# Patient Record
Sex: Male | Born: 1980 | Race: Black or African American | Hispanic: No | Marital: Single | Smoking: Former smoker
Health system: Southern US, Community
[De-identification: ages and names within clinical notes are randomized; demographics above are authoritative.]

---

## 2013-06-01 ENCOUNTER — Emergency Department (HOSPITAL_COMMUNITY)
Admission: EM | Admit: 2013-06-01 | Discharge: 2013-06-01 | Disposition: A | Discharge status: Home / Self Care | Payer: Self-pay | Attending: Emergency Medicine | Admitting: Emergency Medicine

## 2013-06-01 ENCOUNTER — Encounter (HOSPITAL_COMMUNITY): Payer: Self-pay | Admitting: Emergency Medicine

## 2013-06-01 DIAGNOSIS — F17200 Nicotine dependence, unspecified, uncomplicated: Secondary | ICD-10-CM | POA: Insufficient documentation

## 2013-06-01 DIAGNOSIS — R05 Cough: Secondary | ICD-10-CM

## 2013-06-01 DIAGNOSIS — J069 Acute upper respiratory infection, unspecified: Secondary | ICD-10-CM | POA: Insufficient documentation

## 2013-06-01 DIAGNOSIS — R059 Cough, unspecified: Secondary | ICD-10-CM

## 2013-06-01 MED ORDER — ACETAMINOPHEN 325 MG PO TABS
650.0000 mg | ORAL_TABLET | Freq: Once | ORAL | Status: AC
  Administered 2013-06-01: 650 mg via ORAL
  Filled 2013-06-01: qty 2

## 2013-06-01 MED ORDER — IBUPROFEN 800 MG PO TABS: 800.0000 mg | ORAL_TABLET | Freq: Three times a day (TID) | ORAL | Status: DC

## 2013-06-01 MED ORDER — GUAIFENESIN-CODEINE 100-10 MG/5ML PO SYRP: 10.0000 mL | ORAL_SOLUTION | Freq: Three times a day (TID) | ORAL | Status: DC | PRN

## 2013-06-01 MED ORDER — GUAIFENESIN-CODEINE 100-10 MG/5ML PO SOLN
10.0000 mL | Freq: Once | ORAL | Status: AC
  Administered 2013-06-01: 10 mL via ORAL
  Filled 2013-06-01: qty 10

## 2013-06-01 NOTE — ED Provider Notes (Signed)
CSN: [id number]     Arrival date & time 06/01/13  1202 History   First MD Initiated Contact with Patient 06/01/13 1216     Chief Complaint  Patient presents with  . Fever  . Cough     (Consider location/radiation/quality/duration/timing/severity/associated sxs/prior Treatment) Patient is a 33 y.o. male presenting with cough. The history is provided by the patient.  Cough Cough characteristics:  Productive Sputum characteristics:  Yellow Severity:  Moderate Onset quality:  Gradual Duration:  1 day Timing:  Constant Progression:  Unchanged Chronicity:  New Smoker: yes   Relieved by:  Nothing Worsened by:  Nothing tried Ineffective treatments:  None tried Associated symptoms: chills and fever   Associated symptoms: no chest pain, no ear pain, no headaches, no myalgias, no rash, no rhinorrhea, no shortness of breath, no sore throat and no wheezing     History reviewed. No pertinent past medical history. History reviewed. No pertinent past surgical history. History reviewed. No pertinent family history. History  Substance Use Topics  . Smoking status: Current Every Day Smoker  . Smokeless tobacco: Not on file  . Alcohol Use: Yes    Review of Systems  Constitutional: Positive for fever and chills. Negative for activity change and appetite change.  HENT: Positive for congestion. Negative for ear pain, facial swelling, rhinorrhea, sore throat and trouble swallowing.   Eyes: Negative for visual disturbance.  Respiratory: Positive for cough. Negative for chest tightness, shortness of breath, wheezing and stridor.   Cardiovascular: Negative for chest pain.  Gastrointestinal: Negative for nausea and vomiting.  Musculoskeletal: Negative for myalgias, neck pain and neck stiffness.  Skin: Negative.  Negative for rash.  Neurological: Negative for dizziness, weakness, numbness and headaches.  Hematological: Negative for adenopathy.  Psychiatric/Behavioral: Negative for confusion.   All other systems reviewed and are negative.      Allergies  Review of patient's allergies indicates no known allergies.  Home Medications  No current outpatient prescriptions on file. BP 96/70  Pulse 85  Temp(Src) 100.4 F (38 C) (Oral)  Resp 20  Ht 6' (1.829 m)  Wt 210 lb (95.255 kg)  BMI 28.47 kg/m2  SpO2 99% Physical Exam  Nursing note and vitals reviewed. Constitutional: He is oriented to person, place, and time. He appears well-developed and well-nourished. No distress.  HENT:  Head: Normocephalic and atraumatic.  Right Ear: Tympanic membrane and ear canal normal.  Left Ear: Tympanic membrane and ear canal normal.  Mouth/Throat: Uvula is midline, oropharynx is clear and moist and mucous membranes are normal. No oropharyngeal exudate.  Eyes: EOM are normal. Pupils are equal, round, and reactive to light.  Neck: Normal range of motion, full passive range of motion without pain and phonation normal. Neck supple.  Cardiovascular: Normal rate, regular rhythm, normal heart sounds and intact distal pulses.   No murmur heard. Pulmonary/Chest: Effort normal. No stridor. No respiratory distress. He has no wheezes. He has no rales. He exhibits no tenderness.  Coarse lungs sounds bilaterally that improve after cough  No rales or wheezes  Abdominal: Soft. He exhibits no distension. There is no tenderness. There is no rebound and no guarding.  Musculoskeletal: He exhibits no edema.  Lymphadenopathy:    He has no cervical adenopathy.  Neurological: He is alert and oriented to person, place, and time. He exhibits normal muscle tone. Coordination normal.  Skin: Skin is warm and dry. No rash noted.    ED Course  Procedures (including critical care time) Labs Review Labs Reviewed - No  data to display Imaging Review No results found.   EKG Interpretation None      MDM   Final diagnoses:  Cough  URI (upper respiratory infection)    Patient is alert, non-toxic  appearing, tolerating po fluids.  VSS.  Likely viral illness .  Patient agrees to symptomatic treatment  The patient appears reasonably screened and/or stabilized for discharge and I doubt any other medical condition or other [facility] requiring further screening, evaluation, or treatment in the ED at this time prior to discharge.     Wallace Cogliano L. Trisha Mangleriplett, PA-C 06/02/13 2106

## 2013-06-01 NOTE — ED Notes (Signed)
Pt c/o fever and productive cough with yellow sputum since last night.

## 2013-06-01 NOTE — Discharge Instructions (Signed)
Cough, Adult  A cough is a reflex. It helps you clear your throat and airways. A cough can help heal your body. A cough can last 2 or 3 weeks (acute) or may last more than 8 weeks (chronic). Some common causes of a cough can include an infection, allergy, or a cold. HOME CARE  Only take medicine as told by your doctor.  If given, take your medicines (antibiotics) as told. Finish them even if you start to feel better.  Use a cold steam vaporizer or humidier in your home. This can help loosen thick spit (secretions).  Sleep so you are almost sitting up (semi-upright). Use pillows to do this. This helps reduce coughing.  Rest as needed.  Stop smoking if you smoke. GET HELP RIGHT AWAY IF:  You have yellowish-white fluid (pus) in your thick spit.  Your cough gets worse.  Your medicine does not reduce coughing, and you are losing sleep.  You cough up blood.  You have trouble breathing.  Your pain gets worse and medicine does not help.  You have a fever. MAKE SURE YOU:   Understand these instructions.  Will watch your condition.  Will get help right away if you are not doing well or get worse. Document Released: 11/21/2010 Document Revised: 06/02/2011 Document Reviewed: 11/21/2010 [employer] Patient Information 2014 [employer].  

## 2013-06-07 NOTE — ED Provider Notes (Signed)
Medical screening examination/treatment/procedure(s) were performed by non-physician practitioner and as supervising physician I was immediately available for consultation/collaboration.   EKG Interpretation None        Shelda JakesScott W. Giankarlo Leamer, MD 06/07/13 1410

## 2013-07-24 ENCOUNTER — Encounter (HOSPITAL_COMMUNITY): Payer: Self-pay | Admitting: Emergency Medicine

## 2013-07-24 ENCOUNTER — Emergency Department (HOSPITAL_COMMUNITY)
Admission: EM | Admit: 2013-07-24 | Discharge: 2013-07-24 | Disposition: A | Discharge status: Home / Self Care | Payer: Self-pay | Attending: Emergency Medicine | Admitting: Emergency Medicine

## 2013-07-24 DIAGNOSIS — X58XXXA Exposure to other specified factors, initial encounter: Secondary | ICD-10-CM | POA: Insufficient documentation

## 2013-07-24 DIAGNOSIS — S39012A Strain of muscle, fascia and tendon of lower back, initial encounter: Secondary | ICD-10-CM

## 2013-07-24 DIAGNOSIS — S335XXA Sprain of ligaments of lumbar spine, initial encounter: Secondary | ICD-10-CM | POA: Insufficient documentation

## 2013-07-24 DIAGNOSIS — F17200 Nicotine dependence, unspecified, uncomplicated: Secondary | ICD-10-CM | POA: Insufficient documentation

## 2013-07-24 DIAGNOSIS — Y939 Activity, unspecified: Secondary | ICD-10-CM | POA: Insufficient documentation

## 2013-07-24 DIAGNOSIS — Y929 Unspecified place or not applicable: Secondary | ICD-10-CM | POA: Insufficient documentation

## 2013-07-24 LAB — URINALYSIS, ROUTINE W REFLEX MICROSCOPIC
Bilirubin Urine: NEGATIVE
Glucose, UA: NEGATIVE mg/dL
Hgb urine dipstick: NEGATIVE
KETONES UR: NEGATIVE mg/dL
LEUKOCYTES UA: NEGATIVE
Nitrite: NEGATIVE
Protein, ur: NEGATIVE mg/dL
Specific Gravity, Urine: >1.03 — ABNORMAL HIGH (ref 1.005–1.030)
UROBILINOGEN UA: 1 mg/dL (ref 0.0–1.0)
pH: 6 (ref 5.0–8.0)

## 2013-07-24 MED ORDER — KETOROLAC TROMETHAMINE 60 MG/2ML IM SOLN
60.0000 mg | Freq: Once | INTRAMUSCULAR | Status: AC
  Administered 2013-07-24: 60 mg via INTRAMUSCULAR
  Filled 2013-07-24: qty 2

## 2013-07-24 MED ORDER — TRAMADOL HCL 50 MG PO TABS: 50.0000 mg | ORAL_TABLET | Freq: Four times a day (QID) | ORAL | Status: DC | PRN

## 2013-07-24 MED ORDER — METHOCARBAMOL 500 MG PO TABS
500.0000 mg | ORAL_TABLET | Freq: Once | ORAL | Status: AC
  Administered 2013-07-24: 500 mg via ORAL
  Filled 2013-07-24: qty 1

## 2013-07-24 MED ORDER — IBUPROFEN 600 MG PO TABS: 600.0000 mg | ORAL_TABLET | Freq: Four times a day (QID) | ORAL | Status: DC | PRN

## 2013-07-24 MED ORDER — HYDROCODONE-ACETAMINOPHEN 5-325 MG PO TABS
1.0000 | ORAL_TABLET | Freq: Once | ORAL | Status: AC
  Administered 2013-07-24: 1 via ORAL
  Filled 2013-07-24: qty 1

## 2013-07-24 MED ORDER — METHOCARBAMOL 500 MG PO TABS: 500.0000 mg | ORAL_TABLET | Freq: Three times a day (TID) | ORAL | Status: DC | PRN

## 2013-07-24 NOTE — Discharge Instructions (Signed)

## 2013-07-24 NOTE — ED Provider Notes (Signed)
CSN: [id number]     Arrival date & time 07/24/13  0013 History   First MD Initiated Contact with Patient 07/24/13 0020     No chief complaint on file.    (Consider location/radiation/quality/duration/timing/severity/associated sxs/prior Treatment) HPI  patient presents with new onset lower back pain starting several hours ago and has been constant and progressive since. He states started on the left side of his back and now is affecting the right side as well. The pain is worse with movement and lifting the right leg. He has no focal weakness or numbness. He has no dysuria, hematuria or hesitancy. He denies any fevers or chills. No nausea or vomiting. Patient does not know of any trauma or heavy lifting. History reviewed. No pertinent past medical history. History reviewed. No pertinent past surgical history. No family history on file. History  Substance Use Topics  . Smoking status: Current Every Day Smoker  . Smokeless tobacco: Not on file  . Alcohol Use: No    Review of Systems  Constitutional: Negative for fever and chills.  Respiratory: Negative for shortness of breath.   Cardiovascular: Negative for chest pain.  Gastrointestinal: Negative for nausea, vomiting and abdominal pain.  Genitourinary: Negative for dysuria, frequency, hematuria and flank pain.  Musculoskeletal: Positive for back pain and myalgias. Negative for neck pain and neck stiffness.  Skin: Negative for rash and wound.  Neurological: Negative for dizziness, weakness, light-headedness, numbness and headaches.      Allergies  Review of patient's allergies indicates no known allergies.  Home Medications   Prior to Admission medications   Medication Sig Start Date End Date Taking? Authorizing Provider  guaiFENesin-codeine (ROBITUSSIN AC) 100-10 MG/5ML syrup Take 10 mLs by mouth 3 (three) times daily as needed for cough. 06/01/13   Tammy L. Triplett, PA-C  ibuprofen (ADVIL,MOTRIN) 800 MG tablet Take 1 tablet  (800 mg total) by mouth 3 (three) times daily. 06/01/13   Tammy L. Triplett, PA-C   BP 150/78  Pulse 64  Temp(Src) 97.4 F (36.3 C) (Oral)  Resp 18  Ht 6' (1.829 m)  Wt 210 lb (95.255 kg)  BMI 28.47 kg/m2  SpO2 100% Physical Exam  Nursing note and vitals reviewed. Constitutional: He is oriented to person, place, and time. He appears well-developed and well-nourished. No distress.  HENT:  Head: Normocephalic and atraumatic.  Mouth/Throat: Oropharynx is clear and moist.  Eyes: EOM are normal. Pupils are equal, round, and reactive to light.  Neck: Normal range of motion. Neck supple.  Cardiovascular: Normal rate and regular rhythm.   Pulmonary/Chest: Effort normal and breath sounds normal. No respiratory distress. He has no wheezes. He has no rales.  Abdominal: Soft. Bowel sounds are normal. He exhibits no distension and no mass. There is no tenderness. There is no rebound and no guarding.  Musculoskeletal: Normal range of motion. He exhibits tenderness. He exhibits no edema.  No midline thoracic or lumbar tenderness to palpation. Patient does have diffuse lumbar paraspinal tenderness. No CVA tenderness. Negative straight leg raise the patient's pain is exacerbated with raising his right leg.  Neurological: He is alert and oriented to person, place, and time.  5/5 motor in all extremities. Patient is able to ambulate without difficulty. No numbness, specifically saddle anesthesia.  Skin: Skin is warm and dry. No rash noted. No erythema.  Psychiatric: He has a normal mood and affect. His behavior is normal.    ED Course  Procedures (including critical care time) Labs Review Labs Reviewed  URINALYSIS, ROUTINE  W REFLEX MICROSCOPIC    Imaging Review No results found.   EKG Interpretation None      MDM   Final diagnoses:  None    I have a low suspicion for renal stones as the cause of the patient's pain. We'll check a UA for any evidence of blood or infection. I also have a  very low suspicion for cauda equina syndrome since he has a normal neurologic exam. We'll treat symptomatically and reevaluate.  Pain is improved with treatment. No red blood cells in the urine. We'll treat for muscle strain. Patient's encouraged to return to the emergency department for worsening pain, fevers or chills, weakness or numbness or for any concerns.  Loren Raceravid Laureano Hetzer, MD 07/24/13 [phone]

## 2014-04-16 ENCOUNTER — Emergency Department (HOSPITAL_COMMUNITY): Payer: Self-pay

## 2014-04-16 ENCOUNTER — Encounter (HOSPITAL_COMMUNITY): Payer: Self-pay | Admitting: *Deleted

## 2014-04-16 ENCOUNTER — Emergency Department (HOSPITAL_COMMUNITY)
Admission: EM | Admit: 2014-04-16 | Discharge: 2014-04-16 | Disposition: A | Discharge status: Home / Self Care | Payer: Self-pay | Attending: Emergency Medicine | Admitting: Emergency Medicine

## 2014-04-16 DIAGNOSIS — Z79899 Other long term (current) drug therapy: Secondary | ICD-10-CM | POA: Insufficient documentation

## 2014-04-16 DIAGNOSIS — R519 Headache, unspecified: Secondary | ICD-10-CM

## 2014-04-16 DIAGNOSIS — J0110 Acute frontal sinusitis, unspecified: Secondary | ICD-10-CM | POA: Insufficient documentation

## 2014-04-16 DIAGNOSIS — R51 Headache: Secondary | ICD-10-CM

## 2014-04-16 DIAGNOSIS — Z720 Tobacco use: Secondary | ICD-10-CM | POA: Insufficient documentation

## 2014-04-16 MED ORDER — TETRACAINE HCL 0.5 % OP SOLN
1.0000 [drp] | Freq: Once | OPHTHALMIC | Status: AC
  Administered 2014-04-16: 1 [drp] via OPHTHALMIC
  Filled 2014-04-16: qty 2

## 2014-04-16 MED ORDER — AMOXICILLIN-POT CLAVULANATE 875-125 MG PO TABS: 1.0000 | ORAL_TABLET | Freq: Two times a day (BID) | ORAL | Status: DC

## 2014-04-16 MED ORDER — FLUORESCEIN SODIUM 1 MG OP STRP
2.0000 | ORAL_STRIP | Freq: Once | OPHTHALMIC | Status: AC
  Administered 2014-04-16: 2 via OPHTHALMIC
  Filled 2014-04-16: qty 2

## 2014-04-16 MED ORDER — KETOROLAC TROMETHAMINE 60 MG/2ML IM SOLN
60.0000 mg | Freq: Once | INTRAMUSCULAR | Status: AC
  Administered 2014-04-16: 60 mg via INTRAMUSCULAR
  Filled 2014-04-16: qty 2

## 2014-04-16 MED ORDER — AMOXICILLIN-POT CLAVULANATE 875-125 MG PO TABS
1.0000 | ORAL_TABLET | Freq: Once | ORAL | Status: AC
  Administered 2014-04-16: 1 via ORAL
  Filled 2014-04-16: qty 1

## 2014-04-16 NOTE — Discharge Instructions (Signed)
1. Medications: Augmentin, usual home medications 2. Treatment: rest, drink plenty of fluids, ibuprofen and tylenol for pain 3. Follow Up: Please followup with your primary doctor in 3 days for discussion of your diagnoses and further evaluation after today's visit; if you do not have a primary care doctor use the resource guide provided to find one; Please return to the ER for high fevers, stiff neck or other concerning symptoms.   Sinusitis Sinusitis is redness, soreness, and inflammation of the paranasal sinuses. Paranasal sinuses are air pockets within the bones of your face (beneath the eyes, the middle of the forehead, or above the eyes). In healthy paranasal sinuses, mucus is able to drain out, and air is able to circulate through them by way of your nose. However, when your paranasal sinuses are inflamed, mucus and air can become trapped. This can allow bacteria and other germs to grow and cause infection. Sinusitis can develop quickly and last only a short time (acute) or continue over a long period (chronic). Sinusitis that lasts for more than 12 weeks is considered chronic.  CAUSES  Causes of sinusitis include:  Allergies.  Structural abnormalities, such as displacement of the cartilage that separates your nostrils (deviated septum), which can decrease the air flow through your nose and sinuses and affect sinus drainage.  Functional abnormalities, such as when the small hairs (cilia) that line your sinuses and help remove mucus do not work properly or are not present. SIGNS AND SYMPTOMS  Symptoms of acute and chronic sinusitis are the same. The primary symptoms are pain and pressure around the affected sinuses. Other symptoms include:  Upper toothache.  Earache.  Headache.  Bad breath.  Decreased sense of smell and taste.  A cough, which worsens when you are lying flat.  Fatigue.  Fever.  Thick drainage from your nose, which often is green and may contain pus  (purulent).  Swelling and warmth over the affected sinuses. DIAGNOSIS  Your health care provider will perform a physical exam. During the exam, your health care provider may:  Look in your nose for signs of abnormal growths in your nostrils (nasal polyps).  Tap over the affected sinus to check for signs of infection.  View the inside of your sinuses (endoscopy) using an imaging device that has a light attached (endoscope). If your health care provider suspects that you have chronic sinusitis, one or more of the following tests may be recommended:  Allergy tests.  Nasal culture. A sample of mucus is taken from your nose, sent to a lab, and screened for bacteria.  Nasal cytology. A sample of mucus is taken from your nose and examined by your health care provider to determine if your sinusitis is related to an allergy. TREATMENT  Most cases of acute sinusitis are related to a viral infection and will resolve on their own within 10 days. Sometimes medicines are prescribed to help relieve symptoms (pain medicine, decongestants, nasal steroid sprays, or saline sprays).  However, for sinusitis related to a bacterial infection, your health care provider will prescribe antibiotic medicines. These are medicines that will help kill the bacteria causing the infection.  Rarely, sinusitis is caused by a fungal infection. In theses cases, your health care provider will prescribe antifungal medicine. For some cases of chronic sinusitis, surgery is needed. Generally, these are cases in which sinusitis recurs more than 3 times per year, despite other treatments. HOME CARE INSTRUCTIONS   Drink plenty of water. Water helps thin the mucus so your sinuses  can drain more easily.  Use a humidifier.  Inhale steam 3 to 4 times a day (for example, sit in the bathroom with the shower running).  Apply a warm, moist washcloth to your face 3 to 4 times a day, or as directed by your health care provider.  Use  saline nasal sprays to help moisten and clean your sinuses.  Take medicines only as directed by your health care provider.  If you were prescribed either an antibiotic or antifungal medicine, finish it all even if you start to feel better. SEEK IMMEDIATE MEDICAL CARE IF:  You have increasing pain or severe headaches.  You have nausea, vomiting, or drowsiness.  You have swelling around your face.  You have vision problems.  You have a stiff neck.  You have difficulty breathing. MAKE SURE YOU:   Understand these instructions.  Will watch your condition.  Will get help right away if you are not doing well or get worse. Document Released: 03/10/2005 Document Revised: 07/25/2013 Document Reviewed: 03/25/2011 [facility] Patient Information 2015 [place]. This information is not intended to replace advice given to you by your health care provider. Make sure you discuss any questions you have with your health care provider.

## 2014-04-16 NOTE — ED Notes (Signed)
Pt c/o increasing eye pain to left eye; pt states the pain started x 4 days ago and has gotten progressively worse; pt states walking or bending over makes the pain worse

## 2014-04-16 NOTE — ED Provider Notes (Signed)
CSN: [id number]     Arrival date & time 04/16/14  2041 History   First MD Initiated Contact with Patient 04/16/14 2051     Chief Complaint  Patient presents with  . Eye Pain     (Consider location/radiation/quality/duration/timing/severity/associated sxs/prior Treatment) Patient is a 34 y.o. male presenting with eye pain. The history is provided by the patient and medical records. No language interpreter was used.  Eye Pain Associated symptoms include headaches. Pertinent negatives include no abdominal pain, chest pain, coughing, diaphoresis, fatigue, fever, nausea, neck pain, rash or vomiting.     Alan Guzman is a 34 y.o. male  with a hx of no major medical Hx presents to the Emergency Department complaining of gradual, persistent, progressively worsening right eye pain described and pressure with intermittent stabbing onset 4 days ago. Pt reports that initially he only felt the pain when he bent over, but now the pain is present all the time.  He rates the pain at a 9/10.  He has been taking Tylenol with moderate relief.  Associated symptoms include transient blurry vision 2 days ago.  Pt reports URI ssx 2 weeks ago, but reports this does not feel like sinus pain or pressure.  Patient reports there is not pain to the globe of the eye, diplopia or pain with extraocular movements. He denies trauma to the face or eyes. He denies working in a job where the potential for foreign body to the eye exists.  History reviewed. No pertinent past medical history. History reviewed. No pertinent past surgical history. History reviewed. No pertinent family history. History  Substance Use Topics  . Smoking status: Current Every Day Smoker -- 0.50 packs/day    Types: Cigarettes  . Smokeless tobacco: Not on file  . Alcohol Use: Yes     Comment: occasionally    Review of Systems  Constitutional: Negative for fever, diaphoresis, appetite change, fatigue and unexpected weight change.  HENT: Negative for  mouth sores.   Eyes: Positive for pain. Negative for visual disturbance.  Respiratory: Negative for cough, chest tightness, shortness of breath and wheezing.   Cardiovascular: Negative for chest pain.  Gastrointestinal: Negative for nausea, vomiting, abdominal pain, diarrhea and constipation.  Endocrine: Negative for polydipsia, polyphagia and polyuria.  Genitourinary: Negative for dysuria, urgency, frequency and hematuria.  Musculoskeletal: Negative for back pain, neck pain and neck stiffness.  Skin: Negative for rash.  Allergic/Immunologic: Negative for immunocompromised state.  Neurological: Positive for headaches. Negative for syncope and light-headedness.  Hematological: Does not bruise/bleed easily.  Psychiatric/Behavioral: Negative for sleep disturbance. The patient is not nervous/anxious.       Allergies  Review of patient's allergies indicates no known allergies.  Home Medications   Prior to Admission medications   Medication Sig Start Date End Date Taking? Authorizing Provider  acetaminophen (TYLENOL) 500 MG tablet Take 500 mg by mouth every 6 (six) hours as needed for mild pain or moderate pain.   Yes Historical Provider, MD  amoxicillin-clavulanate (AUGMENTIN) 875-125 MG per tablet Take 1 tablet by mouth every 12 (twelve) hours. 04/16/14   Danitza Schoenfeldt, PA-C  guaiFENesin-codeine (ROBITUSSIN AC) 100-10 MG/5ML syrup Take 10 mLs by mouth 3 (three) times daily as needed for cough. Patient not taking: Reported on 04/16/2014 06/01/13   Tammy L. Triplett, PA-C  ibuprofen (ADVIL,MOTRIN) 600 MG tablet Take 1 tablet (600 mg total) by mouth every 6 (six) hours as needed. Patient not taking: Reported on 04/16/2014 07/24/13   Loren Racer, MD  ibuprofen (ADVIL,MOTRIN) 800  MG tablet Take 1 tablet (800 mg total) by mouth 3 (three) times daily. Patient not taking: Reported on 04/16/2014 06/01/13   Tammy L. Triplett, PA-C  methocarbamol (ROBAXIN) 500 MG tablet Take 1 tablet (500 mg  total) by mouth every 8 (eight) hours as needed for muscle spasms. Patient not taking: Reported on 04/16/2014 07/24/13   Loren Racer, MD  traMADol (ULTRAM) 50 MG tablet Take 1 tablet (50 mg total) by mouth every 6 (six) hours as needed. Patient not taking: Reported on 04/16/2014 07/24/13   Loren Racer, MD   BP 147/81 mmHg  Pulse 78  Temp(Src) 98.2 F (36.8 C) (Oral)  Resp 20  Ht 6' (1.829 m)  Wt 210 lb (95.255 kg)  BMI 28.47 kg/m2  SpO2 100% Physical Exam  Constitutional: He is oriented to person, place, and time. He appears well-developed and well-nourished. No distress.  HENT:  Head: Normocephalic and atraumatic.  Nose: Nose normal. No mucosal edema or rhinorrhea.  Mouth/Throat: Uvula is midline, oropharynx is clear and moist and mucous membranes are normal. No uvula swelling. No oropharyngeal exudate, posterior oropharyngeal edema, posterior oropharyngeal erythema or tonsillar abscesses.  Eyes: Conjunctivae, EOM and lids are normal. Pupils are equal, round, and reactive to light. Lids are everted and swept, no foreign bodies found. Right eye exhibits no chemosis, no discharge and no exudate. No foreign body present in the right eye. Left eye exhibits no chemosis, no discharge and no exudate. No foreign body present in the left eye. Right conjunctiva is not injected. Right conjunctiva has no hemorrhage. Left conjunctiva is not injected. Left conjunctiva has no hemorrhage.  Slit lamp exam:      The right eye shows no corneal abrasion, no corneal flare, no corneal ulcer, no foreign body, no fluorescein uptake and no anterior chamber bulge.       The left eye shows no corneal abrasion, no corneal flare, no corneal ulcer, no foreign body, no fluorescein uptake and no anterior chamber bulge.  Pupils equal round and reactive to light No vertical, horizontal or rotational nystagmus No Corneal abrasion noted with fluorescein exam No visible foreign body No corneal flare, ulcer or dendritic  staining  No herpetic lesions to the face or around the eye  Tono: R - 18; L - 17  Visual Acuity: Bilateral Distance: 20/20 ; R Distance: 20/20 ; L Distance: 20/15  Neck: Normal range of motion.  Full range of motion without pain No midline or paraspinal tenderness No nuchal rigidity; no meningeal signs  Cardiovascular: Normal rate, regular rhythm, normal heart sounds and intact distal pulses.   No murmur heard. Pulmonary/Chest: Effort normal. No respiratory distress.  Musculoskeletal: Normal range of motion.  Neurological: He is alert and oriented to person, place, and time.  Mental Status:  Alert, oriented, thought content appropriate. Speech fluent without evidence of aphasia. Able to follow 2 step commands without difficulty.   Cranial Nerves:  II:  Peripheral visual fields grossly normal, pupils equal, round, reactive to light III,IV, VI: ptosis not present, extra-ocular motions intact bilaterally  V,VII: smile symmetric, facial light touch sensation equal VIII: hearing grossly normal bilaterally  IX,X: gag reflex present  XI: bilateral shoulder shrug equal and strong XII: midline tongue extension   Skin: Skin is warm and dry. He is not diaphoretic. No erythema.  Psychiatric: He has a normal mood and affect.  Nursing note and vitals reviewed.   ED Course  Procedures (including critical care time) Labs Review Labs Reviewed - No data to  display  Imaging Review Ct Head Wo Contrast  04/16/2014   CLINICAL DATA:  History of increasing left eye pain for past 4 days. Headache. Initial evaluation.  EXAM: CT HEAD WITHOUT CONTRAST  TECHNIQUE: Contiguous axial images were obtained from the base of the skull through the vertex without intravenous contrast.  COMPARISON:  None.  FINDINGS: There is no acute intracranial hemorrhage or infarct. No mass lesion or midline shift. Gray-white matter differentiation is well maintained. Ventricles are normal in size without evidence of  hydrocephalus. CSF containing spaces are within normal limits. No extra-axial fluid collection.  The calvarium is intact.  Orbital soft tissues are within normal limits.  There is extensive mucosal thickening with opacification of the maxillary sinuses, ethmoidal air cells, sphenoid sinuses, and to a lesser extent the frontal sinuses, left greater than right. Air-fluid level within the left frontal sinus suggest active sinus disease. No mastoid effusion.  Scalp soft tissues are unremarkable.  IMPRESSION: 1. No acute intracranial process. 2. Moderate to advanced pansinus disease. Air-fluid level within the left frontal sinus suggests active sinusitis. This may be either infectious or inflammatory in nature.   Electronically Signed   By: Rise MuBenjamin  McClintock M.D.   On: 04/16/2014 22:46     EKG Interpretation None      MDM   Final diagnoses:  Headache  Acute frontal sinusitis, recurrence not specified   Alan Guzman presents with left eye and forehead pain.  Normal eye exam.  Will obtain CT head and reassess.  Pt reports pain is now at a 5/10 which he believes is from the Tylenol he took PTA.  No evidence of acute angle glaucoma. No visual deficits, doubt optic neuritis.  No visual field cuts and no evidence of CVA.  No evidence of iritis.  10:59 PM CT with moderate to advanced pansinus disease. Air-fluid level within the left frontal sinus suggests active sinusitis.  Severe symptoms have been present several days occuring almost 2 weeks after a URI.  Acute sinusitis via CT scan.  Concern for acute bacterial rhinosinusitis.  Patient discharged with Augmentin.  Instructions given for warm saline nasal wash and recommendations for follow-up with primary care physician.    I have personally reviewed patient's vitals, nursing note and any pertinent labs or imaging.  I performed an focused physical exam; undressed when appropriate .    It has been determined that no acute conditions requiring further  emergency intervention are present at this time. The patient/guardian have been advised of the diagnosis and plan. I reviewed any labs and imaging including any potential incidental findings. We have discussed signs and symptoms that warrant return to the ED and they are listed in the discharge instructions.    Vital signs are stable at discharge.   BP 147/81 mmHg  Pulse 78  Temp(Src) 98.2 F (36.8 C) (Oral)  Resp 20  Ht 6' (1.829 m)  Wt 210 lb (95.255 kg)  BMI 28.47 kg/m2  SpO2 100%  The patient was discussed with and seen by Dr. Judd Lienelo who agrees with the treatment plan.     Dahlia ClientHannah Tameka Hoiland, PA-C 04/16/14 [id number]  Geoffery Lyonsouglas Delo, MD 04/16/14 [phone]

## 2015-06-20 ENCOUNTER — Encounter (HOSPITAL_COMMUNITY): Payer: Self-pay | Admitting: *Deleted

## 2015-06-20 ENCOUNTER — Emergency Department (HOSPITAL_COMMUNITY): Payer: Self-pay

## 2015-06-20 ENCOUNTER — Emergency Department (HOSPITAL_COMMUNITY)
Admission: EM | Admit: 2015-06-20 | Discharge: 2015-06-20 | Disposition: A | Discharge status: Home / Self Care | Payer: Self-pay | Attending: Emergency Medicine | Admitting: Emergency Medicine

## 2015-06-20 DIAGNOSIS — M25562 Pain in left knee: Secondary | ICD-10-CM | POA: Insufficient documentation

## 2015-06-20 DIAGNOSIS — F17210 Nicotine dependence, cigarettes, uncomplicated: Secondary | ICD-10-CM | POA: Insufficient documentation

## 2015-06-20 MED ORDER — ACETAMINOPHEN 325 MG PO TABS
650.0000 mg | ORAL_TABLET | Freq: Once | ORAL | Status: AC
  Administered 2015-06-20: 650 mg via ORAL
  Filled 2015-06-20: qty 2

## 2015-06-20 MED ORDER — DEXAMETHASONE 4 MG PO TABS: 4.0000 mg | ORAL_TABLET | Freq: Two times a day (BID) | ORAL | Status: DC

## 2015-06-20 MED ORDER — TRAMADOL HCL 50 MG PO TABS: 50.0000 mg | ORAL_TABLET | Freq: Four times a day (QID) | ORAL | Status: DC | PRN

## 2015-06-20 MED ORDER — DICLOFENAC SODIUM 75 MG PO TBEC
75.0000 mg | DELAYED_RELEASE_TABLET | Freq: Two times a day (BID) | ORAL | Status: DC
Start: 2015-06-20 — End: 2015-06-28

## 2015-06-20 MED ORDER — KETOROLAC TROMETHAMINE 10 MG PO TABS
10.0000 mg | ORAL_TABLET | Freq: Once | ORAL | Status: AC
  Administered 2015-06-20: 10 mg via ORAL
  Filled 2015-06-20: qty 1

## 2015-06-20 NOTE — Discharge Instructions (Signed)
The x-ray of your knee is negative for fracture or dislocation or joint effusion. Please see Dr. Romeo AppleHarrison for additional evaluation and management of your pain, and to evaluate any other reason you should have the knee symptoms. Please use the knee immobilizer when you're up and about, you did not need to sleep in this device. Use Decadron and diclofenac 2 times daily with food until all taken. May use Ultram for more severe pain. This medication may cause drowsiness. Please do not drive, drink alcohol, operate machinery when using this medication. Knee Pain Knee pain is a very common symptom and can have many causes. Knee pain often goes away when you follow your health care provider's instructions for relieving pain and discomfort at home. However, knee pain can develop into a condition that needs treatment. Some conditions may include:  Arthritis caused by wear and tear (osteoarthritis).  Arthritis caused by swelling and irritation (rheumatoid arthritis or gout).  A cyst or growth in your knee.  An infection in your knee joint.  An injury that will not heal.  Damage, swelling, or irritation of the tissues that support your knee (torn ligaments or tendinitis). If your knee pain continues, additional tests may be ordered to diagnose your condition. Tests may include X-rays or other imaging studies of your knee. You may also need to have fluid removed from your knee. Treatment for ongoing knee pain depends on the cause, but treatment may include:  Medicines to relieve pain or swelling.  Steroid injections in your knee.  Physical therapy.  Surgery. HOME CARE INSTRUCTIONS  Take medicines only as directed by your health care provider.  Rest your knee and keep it raised (elevated) while you are resting.  Do not do things that cause or worsen pain.  Avoid high-impact activities or exercises, such as running, jumping rope, or doing jumping jacks.  Apply ice to the knee area:  Put ice in  a plastic bag.  Place a towel between your skin and the bag.  Leave the ice on for 20 minutes, 2-3 times a day.  Ask your health care provider if you should wear an elastic knee support.  Keep a pillow under your knee when you sleep.  Lose weight if you are overweight. Extra weight can put pressure on your knee.  Do not use any tobacco products, including cigarettes, chewing tobacco, or electronic cigarettes. If you need help quitting, ask your health care provider. Smoking may slow the healing of any bone and joint problems that you may have. SEEK MEDICAL CARE IF:  Your knee pain continues, changes, or gets worse.  You have a fever along with knee pain.  Your knee buckles or locks up.  Your knee becomes more swollen. SEEK IMMEDIATE MEDICAL CARE IF:   Your knee joint feels hot to the touch.  You have chest pain or trouble breathing.   This information is not intended to replace advice given to you by your health care provider. Make sure you discuss any questions you have with your health care provider.   Document Released: 01/05/2007 Document Revised: 03/31/2014 Document Reviewed: 10/24/2013 Elsevier Interactive Patient Education [employer]2016 [employer] Inc[employer]

## 2015-06-20 NOTE — ED Provider Notes (Signed)
CSN: [id number]     Arrival date & time 06/20/15  [id number] History   None    Chief Complaint  Patient presents with  . Knee Pain     (Consider location/radiation/quality/duration/timing/severity/associated sxs/prior Treatment) Patient is a 35 y.o. male presenting with knee pain. The history is provided by the patient.  Knee Pain Location:  Knee Time since incident:  5 days Injury: no   Knee location:  L knee Pain details:    Quality:  Aching and shooting   Severity:  Moderate   Onset quality:  Gradual   Duration:  5 days   Timing:  Intermittent   Progression:  Worsening Chronicity:  New Dislocation: no   Relieved by:  Nothing Worsened by:  Bearing weight Ineffective treatments:  NSAIDs Associated symptoms: decreased ROM   Associated symptoms: no back pain, no fever and no neck pain   Risk factors: no frequent fractures and no recent illness     History reviewed. No pertinent past medical history. History reviewed. No pertinent past surgical history. History reviewed. No pertinent family history. Social History  Substance Use Topics  . Smoking status: Current Every Day Smoker -- 0.50 packs/day    Types: Cigarettes  . Smokeless tobacco: None  . Alcohol Use: Yes     Comment: occasionally    Review of Systems  Constitutional: Negative for fever and activity change.       All ROS Neg except as noted in HPI  HENT: Negative for nosebleeds.   Eyes: Negative for photophobia and discharge.  Respiratory: Negative for cough, shortness of breath and wheezing.   Cardiovascular: Negative for chest pain and palpitations.  Gastrointestinal: Negative for abdominal pain and blood in stool.  Genitourinary: Negative for dysuria, frequency and hematuria.  Musculoskeletal: Negative for back pain, arthralgias and neck pain.  Skin: Negative.   Neurological: Negative for dizziness, seizures and speech difficulty.  Psychiatric/Behavioral: Negative for hallucinations and confusion.       Allergies  Review of patient's allergies indicates no known allergies.  Home Medications   Prior to Admission medications   Medication Sig Start Date End Date Taking? Authorizing Provider  acetaminophen (TYLENOL) 500 MG tablet Take 500 mg by mouth every 6 (six) hours as needed for mild pain or moderate pain.    Historical Provider, MD  amoxicillin-clavulanate (AUGMENTIN) 875-125 MG per tablet Take 1 tablet by mouth every 12 (twelve) hours. 04/16/14   Hannah Muthersbaugh, PA-C  guaiFENesin-codeine (ROBITUSSIN AC) 100-10 MG/5ML syrup Take 10 mLs by mouth 3 (three) times daily as needed for cough. Patient not taking: Reported on 04/16/2014 06/01/13   Tammy Triplett, PA-C  ibuprofen (ADVIL,MOTRIN) 600 MG tablet Take 1 tablet (600 mg total) by mouth every 6 (six) hours as needed. Patient not taking: Reported on 04/16/2014 07/24/13   Loren Raceravid Yelverton, MD  ibuprofen (ADVIL,MOTRIN) 800 MG tablet Take 1 tablet (800 mg total) by mouth 3 (three) times daily. Patient not taking: Reported on 04/16/2014 06/01/13   Tammy Triplett, PA-C  methocarbamol (ROBAXIN) 500 MG tablet Take 1 tablet (500 mg total) by mouth every 8 (eight) hours as needed for muscle spasms. Patient not taking: Reported on 04/16/2014 07/24/13   Loren Raceravid Yelverton, MD  traMADol (ULTRAM) 50 MG tablet Take 1 tablet (50 mg total) by mouth every 6 (six) hours as needed. Patient not taking: Reported on 04/16/2014 07/24/13   Loren Raceravid Yelverton, MD   BP 155/80 mmHg  Pulse 77  Temp(Src) 98 F (36.7 C) (Oral)  Resp 18  Ht 6' (  1.829 m)  Wt 108.863 kg  BMI 32.54 kg/m2  SpO2 100% Physical Exam  Constitutional: He is oriented to person, place, and time. He appears well-developed and well-nourished.  Non-toxic appearance.  HENT:  Head: Normocephalic.  Right Ear: Tympanic membrane and external ear normal.  Left Ear: Tympanic membrane and external ear normal.  Eyes: EOM and lids are normal. Pupils are equal, round, and reactive to light.  Neck: Normal  range of motion. Neck supple. Carotid bruit is not present.  Cardiovascular: Normal rate, regular rhythm, normal heart sounds, intact distal pulses and normal pulses.   Pulmonary/Chest: Breath sounds normal. No respiratory distress.  Abdominal: Soft. Bowel sounds are normal. There is no tenderness. There is no guarding.  Musculoskeletal:       Left knee: He exhibits decreased range of motion. He exhibits no swelling, no effusion and no deformity.  Pain with attempted flexion. No posterior mass.  Lymphadenopathy:       Head (right side): No submandibular adenopathy present.       Head (left side): No submandibular adenopathy present.    He has no cervical adenopathy.  Neurological: He is alert and oriented to person, place, and time. He has normal strength. No cranial nerve deficit or sensory deficit.  Skin: Skin is warm and dry.  Psychiatric: He has a normal mood and affect. His speech is normal.  Nursing note and vitals reviewed.   ED Course  Procedures (including critical care time) Labs Review Labs Reviewed - No data to display  Imaging Review Dg Knee Complete 4 Views Left  06/20/2015  CLINICAL DATA:  Pain about the left patella radiating into the upper and lower leg for 1 week. No known injury. Initial encounter. EXAM: LEFT KNEE - COMPLETE 4+ VIEW COMPARISON:  None. FINDINGS: There is no evidence of fracture, dislocation, or joint effusion. There is no evidence of arthropathy or other focal bone abnormality. Soft tissues are unremarkable. IMPRESSION: Normal study. Electronically Signed   By: Drusilla Kanner M.D.   On: 06/20/2015 07:47   I have personally reviewed and evaluated these images and lab results as part of my medical decision-making.   EKG Interpretation None      MDM  Xray of the left knee is negative. No effusion. No hot joint. Significant limitation with attempted flexing the knee. No hot joint, temp elevation or tachycardia. No fx seen on xray. Pt referred to  Dr Romeo Apple for orthopedic evaluation.  Rx for diclofenac, ultram, and decadron given to the patient. Pt provided a knee immobilizer.   Final diagnoses:  Knee pain, left    *I have reviewed nursing notes, vital signs, and all appropriate lab and imaging results for this patient.Ivery Quale, PA-C 06/20/15 1018  Donnetta Hutching, MD 06/20/15 [phone]

## 2015-06-20 NOTE — ED Notes (Signed)
Pt reports left knee pain. Denies injury but states he has a stabbing pain and has trouble bearing weight at times.

## 2015-06-21 ENCOUNTER — Ambulatory Visit (INDEPENDENT_AMBULATORY_CARE_PROVIDER_SITE_OTHER): Payer: BLUE CROSS/BLUE SHIELD | Admitting: Orthopaedic Surgery

## 2015-06-21 ENCOUNTER — Encounter: Payer: Self-pay | Admitting: Orthopaedic Surgery

## 2015-06-21 VITALS — BP 163/90 | HR 84 | Temp 99.3°F | Ht 72.0 in | Wt 247.2 lb

## 2015-06-21 DIAGNOSIS — M25562 Pain in left knee: Secondary | ICD-10-CM

## 2015-06-21 MED ORDER — TRAMADOL HCL 50 MG PO TABS: 50.0000 mg | ORAL_TABLET | Freq: Four times a day (QID) | ORAL | Status: DC | PRN

## 2015-06-21 NOTE — Progress Notes (Signed)
Subjective: Left knee pain    Patient ID: Alan Guzman, male    DOB: 09/03/1980, 35 y.o.   MRN: [id number]  Knee Pain  The incident occurred 5 to 7 days ago. There was no injury mechanism. The pain is present in the left knee. The quality of the pain is described as aching, burning and stabbing. The pain is at a severity of 5/10. The pain is moderate. The pain has been worsening since onset. Associated symptoms include an inability to bear weight and a loss of motion. Pertinent negatives include no loss of sensation, muscle weakness, numbness or tingling. The symptoms are aggravated by weight bearing. He has tried acetaminophen, heat, elevation, ice, immobilization, non-weight bearing and rest for the symptoms. The treatment provided mild relief.   He began having pain in the left knee about 6 or 7 days ago.  He has no trauma.  He has swelling.  He cannot bear weight on the knee.  He cannot extend the knee.  He has giving way of the left knee.  He was seen in the ER on 06/20/15.  I have reviewed the x-rays, the x-ray report and the ER records.  He was given a knee immobilizer from the ER.  I have given him a knee sleeve in the office today and Rx for Crutches.  He will need a MRI of his left knee has he lacks full extension by 5 degrees and has instability of the knee.  I am concerned about a possible medial meniscus tear.   Review of Systems  Constitutional:       He is an active smoker.  HENT: Negative for congestion.   Respiratory: Negative for cough and shortness of breath.   Cardiovascular: Negative for chest pain.  Endocrine: Negative for cold intolerance.  Musculoskeletal: Positive for myalgias, joint swelling, arthralgias and gait problem.  Allergic/Immunologic: Negative for environmental allergies.  Neurological: Negative for tingling and numbness.  All other systems reviewed and are negative.  History reviewed. No pertinent past medical history. History reviewed. No pertinent  past surgical history.    Social History   Social History  . Marital Status: Single    Spouse Name: N/A  . Number of Children: N/A  . Years of Education: N/A   Occupational History  . Not on file.   Social History Main Topics  . Smoking status: Current Every Day Smoker -- 0.50 packs/day    Types: Cigarettes  . Smokeless tobacco: Not on file  . Alcohol Use: Yes     Comment: occasionally  . Drug Use: No  . Sexual Activity: Not on file   Other Topics Concern  . Not on file   Social History Narrative   BP 163/90 mmHg  Pulse 84  Temp(Src) 99.3 F (37.4 C)  Ht 6' (1.829 m)  Wt 247 lb 3.2 oz (112.129 kg)  BMI 33.52 kg/m2  Objective:   Physical Exam  Constitutional: He is oriented to person, place, and time. He appears well-developed and well-nourished.  HENT:  Head: Normocephalic and atraumatic.  Eyes: Conjunctivae and EOM are normal. Pupils are equal, round, and reactive to light.  Neck: Normal range of motion. Neck supple.  Cardiovascular: Normal rate, regular rhythm and intact distal pulses.   Pulmonary/Chest: Effort normal.  Abdominal: Soft.  Musculoskeletal: He exhibits tenderness (Pain left knee, effusion 1+, ROM 5 to 95 with pain, Positive Medial McMurray, no redness, NV intact.).       Left knee: He exhibits decreased range  of motion and effusion. Tenderness found. Medial joint line tenderness noted.       Legs: Neurological: He is alert and oriented to person, place, and time. He has normal reflexes. No cranial nerve deficit. He exhibits normal muscle tone. Coordination normal.  Skin: Skin is warm and dry.  Psychiatric: He has a normal mood and affect. His behavior is normal. Judgment and thought content normal.    Encounter Diagnosis  Name Primary?  . Left knee pain Yes   I have given a knee sleeve.  I have given Rx for crutches.  I have recommended MRI of the left knee as he lacks full extension, has positive medial McMurray and instability.  I am  concerned about a medial meniscus tear.     Assessment & Plan:  Get MRI of the left knee.  Use crutches.  Return after the MRI.  Call if any problem.  Stay off work.  I can fill out FLA form.

## 2015-06-21 NOTE — Patient Instructions (Signed)
Out of work 

## 2015-06-26 ENCOUNTER — Telehealth: Payer: Self-pay | Admitting: Orthopaedic Surgery

## 2015-06-26 ENCOUNTER — Encounter: Payer: Self-pay | Admitting: Orthopaedic Surgery

## 2015-06-26 ENCOUNTER — Ambulatory Visit (HOSPITAL_COMMUNITY)
Admission: RE | Admit: 2015-06-26 | Discharge: 2015-06-26 | Disposition: A | Discharge status: Home / Self Care | Payer: BLUE CROSS/BLUE SHIELD | Source: Ambulatory Visit | Attending: Orthopaedic Surgery | Admitting: Orthopaedic Surgery

## 2015-06-26 ENCOUNTER — Ambulatory Visit (INDEPENDENT_AMBULATORY_CARE_PROVIDER_SITE_OTHER): Payer: BLUE CROSS/BLUE SHIELD | Admitting: Orthopaedic Surgery

## 2015-06-26 ENCOUNTER — Other Ambulatory Visit: Payer: Self-pay | Admitting: *Deleted

## 2015-06-26 DIAGNOSIS — M25362 Other instability, left knee: Secondary | ICD-10-CM

## 2015-06-26 DIAGNOSIS — M2242 Chondromalacia patellae, left knee: Secondary | ICD-10-CM | POA: Insufficient documentation

## 2015-06-26 DIAGNOSIS — M25562 Pain in left knee: Secondary | ICD-10-CM | POA: Diagnosis not present

## 2015-06-26 DIAGNOSIS — M948X6 Other specified disorders of cartilage, lower leg: Secondary | ICD-10-CM | POA: Insufficient documentation

## 2015-06-26 NOTE — Telephone Encounter (Signed)
Regarding MRI CPT [id number], contacted insurer [employer]; [facility] Dr Hilda LiasKeeling then personally spoke with, and received approval for MRI, pre-authorization# [id number]. MRI has been scheduled for today, 06/27/15, stat. Patient aware.

## 2015-06-27 ENCOUNTER — Telehealth: Payer: Self-pay | Admitting: Orthopaedic Surgery

## 2015-06-27 NOTE — Telephone Encounter (Signed)
Patient called to inquire about MRI results - ph# [phone]

## 2015-06-27 NOTE — Telephone Encounter (Signed)
He is to come in.  I was expecting him today.  I do not give results over the telephone.  He can look on MyChart if he wants anytime.

## 2015-06-27 NOTE — Telephone Encounter (Signed)
Spoke with patient 1:49pm and relayed Dr Hilda LiasKeeling is expecting him today to review results. Patient states unable to come today; therefore, scheduled for tomorrow, 06/28/15, 8:30am

## 2015-06-28 ENCOUNTER — Ambulatory Visit (INDEPENDENT_AMBULATORY_CARE_PROVIDER_SITE_OTHER): Payer: BLUE CROSS/BLUE SHIELD | Admitting: Orthopaedic Surgery

## 2015-06-28 ENCOUNTER — Encounter: Payer: Self-pay | Admitting: Orthopaedic Surgery

## 2015-06-28 VITALS — BP 156/112 | HR 67 | Temp 98.4°F | Ht 72.0 in | Wt 252.2 lb

## 2015-06-28 DIAGNOSIS — M25562 Pain in left knee: Secondary | ICD-10-CM | POA: Diagnosis not present

## 2015-06-28 MED ORDER — DICLOFENAC SODIUM 75 MG PO TBEC: 75.0000 mg | DELAYED_RELEASE_TABLET | Freq: Two times a day (BID) | ORAL | Status: DC

## 2015-06-28 NOTE — Patient Instructions (Signed)
Out of work 

## 2015-06-28 NOTE — Progress Notes (Signed)
Patient ZO:XWRU:Alan Guzman Guzman, male DOB:09/28/1980, 35 y.o. EAV:[id number]  Chief Complaint  Patient presents with  . Knee Pain    left side    HPI  Alan Guzman is a 35 y.o. male who is seen for follow-up of the left knee pain.  He had a MRI done on 06/26/15.  Findings were:  IMPRESSION: 1. No meniscal or ligamentous injury of the left knee. 2. 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  He is using a knee sleeve.  His pain is less.  He still cannot fully extend the knee but he has less swelling and less tenderness of the joint lines.  He has no new trauma.  He has no locking or new trauma.  He has no redness. HPI  Body mass index is 34.2 kg/(m^2).  Review of Systems  Constitutional:       He is an active smoker.  HENT: Negative for congestion.   Respiratory: Negative for cough and shortness of breath.   Cardiovascular: Negative for chest pain.  Endocrine: Negative for cold intolerance.  Musculoskeletal: Positive for myalgias, joint swelling, arthralgias and gait problem.  Allergic/Immunologic: Negative for environmental allergies.  Neurological: Negative for numbness.  All other systems reviewed and are negative.   No past medical history on file.  No past surgical history on file.  No family history on file.  Social History Social History  Substance Use Topics  . Smoking status: Current Every Day Smoker -- 0.50 packs/day    Types: Cigarettes  . Smokeless tobacco: Never Used  . Alcohol Use: 0.0 oz/week    0 Standard drinks or equivalent per week     Comment: occasionally    No Known Allergies  Current Outpatient Prescriptions  Medication Sig Dispense Refill  . acetaminophen (TYLENOL) 500 MG tablet Take 500 mg by mouth every 6 (six) hours as needed for mild pain or moderate pain.    Marland Kitchen. dexamethasone (DECADRON) 4 MG tablet Take 1 tablet (4 mg total) by mouth 2 (two) times  daily with a meal. 12 tablet 0  . ibuprofen (ADVIL,MOTRIN) 800 MG tablet Take 1 tablet (800 mg total) by mouth 3 (three) times daily. 21 tablet 0  . traMADol (ULTRAM) 50 MG tablet Take 1 tablet (50 mg total) by mouth every 6 (six) hours as needed. 15 tablet 0  . traMADol (ULTRAM) 50 MG tablet Take 1 tablet (50 mg total) by mouth every 6 (six) hours as needed. 60 tablet 3  . diclofenac (VOLTAREN) 75 MG EC tablet Take 1 tablet (75 mg total) by mouth 2 (two) times daily with a meal. 60 tablet 2   No current facility-administered medications for this visit.     Physical Exam  Blood pressure 156/112, pulse 67, temperature 98.4 F (36.9 C), temperature source Tympanic, height 6' (1.829 m), weight 252 lb 3.2 oz (114.397 kg).  Constitutional: overall normal hygiene, normal nutrition, well developed, normal grooming, normal body habitus. Assistive device:braces  Musculoskeletal: gait and station Limp left, muscle tone and strength are normal, no tremors or atrophy is present.  .  Neurological: coordination overall normal.  Deep tendon reflex/nerve stretch intact.  Sensation normal.  Cranial nerves II-XII intact.   Skin:   normal overall no scars, lesions, ulcers or rashes. No psoriasis.  Psychiatric: Alert and oriented x 3.  Recent memory intact, remote memory unclear.  Normal mood and affect. Well groomed.  Good eye contact.  Cardiovascular: overall  no swelling, no varicosities, no edema bilaterally, normal temperatures of the legs and arms, no clubbing, cyanosis and good capillary refill.  Lymphatic: palpation is normal.  The left lower extremity is examined:  Inspection:  Thigh:  Non-tender and no defects  Knee has swelling 2+ effusion.                        Joint tenderness is present                        Patient is tender over the medial joint line  Lower Leg:  Has normal appearance and no tenderness or defects  Ankle:  Non-tender and no defects  Foot:  Non-tender and no  defects Range of Motion:  Knee:  Range of motion is: 5-105                        Crepitus is  present  Ankle:  Range of motion is normal. Strength and Tone:  The left lower extremity has normal strength and tone. Stability:  Knee:  The knee is stable.  Ankle:  The ankle is stable.   The patient has been educated about the nature of the problem(s) and counseled on treatment options.  The patient appeared to understand what I have discussed and is in agreement with it.  Encounter Diagnosis  Name Primary?  . Left knee pain Yes    PLAN  I have gone over the findings of the MRI.  I have asked he stay out of work and limit weight bearing to the left knee.  Continue the knee sleeve.  Call if any problems.  Precautions discussed.  Continue current medications.   Return to clinic 2 weeks   X-rays on return of the left knee.

## 2015-07-10 ENCOUNTER — Ambulatory Visit (INDEPENDENT_AMBULATORY_CARE_PROVIDER_SITE_OTHER): Payer: BLUE CROSS/BLUE SHIELD | Admitting: Orthopaedic Surgery

## 2015-07-10 ENCOUNTER — Ambulatory Visit (INDEPENDENT_AMBULATORY_CARE_PROVIDER_SITE_OTHER): Payer: BLUE CROSS/BLUE SHIELD

## 2015-07-10 ENCOUNTER — Encounter: Payer: Self-pay | Admitting: Orthopaedic Surgery

## 2015-07-10 VITALS — BP 154/95 | HR 80 | Temp 99.5°F | Ht 72.0 in | Wt 250.0 lb

## 2015-07-10 DIAGNOSIS — M25562 Pain in left knee: Secondary | ICD-10-CM

## 2015-07-10 NOTE — Progress Notes (Signed)
Patient ZO:XWRU:Alan Guzman, male DOB:05/16/1980, 35 y.o. EAV:[id number]  Chief Complaint  Patient presents with  . Follow-up    left knee    HPI  Alan Guzman is a 35 y.o. male who has history of left knee injury.  MRI showed chondral fissure but no fracture.  He is doing well. He is using his crutches and knee wrap.  He has no trauma.  He is staying off the knee.  He is out of work. I will continue the same.  HPI  Body mass index is 33.9 kg/(m^2).   Review of Systems  Constitutional:       He is an active smoker.  HENT: Negative for congestion.   Respiratory: Negative for cough and shortness of breath.   Cardiovascular: Negative for chest pain.  Endocrine: Negative for cold intolerance.  Musculoskeletal: Positive for myalgias, joint swelling, arthralgias and gait problem.  Allergic/Immunologic: Negative for environmental allergies.  Neurological: Negative for numbness.  All other systems reviewed and are negative.   History reviewed. No pertinent past medical history.  History reviewed. No pertinent past surgical history.  History reviewed. No pertinent family history.  Social History Social History  Substance Use Topics  . Smoking status: Current Every Day Smoker -- 0.50 packs/day    Types: Cigarettes  . Smokeless tobacco: Never Used  . Alcohol Use: 0.0 oz/week    0 Standard drinks or equivalent per week     Comment: occasionally    No Known Allergies  Current Outpatient Prescriptions  Medication Sig Dispense Refill  . acetaminophen (TYLENOL) 500 MG tablet Take 500 mg by mouth every 6 (six) hours as needed for mild pain or moderate pain.    Marland Kitchen. dexamethasone (DECADRON) 4 MG tablet Take 1 tablet (4 mg total) by mouth 2 (two) times daily with a meal. 12 tablet 0  . diclofenac (VOLTAREN) 75 MG EC tablet Take 1 tablet (75 mg total) by mouth 2 (two) times daily with a meal. 60 tablet 2  . ibuprofen (ADVIL,MOTRIN) 800 MG tablet Take 1 tablet (800 mg total) by mouth 3  (three) times daily. 21 tablet 0  . traMADol (ULTRAM) 50 MG tablet Take 1 tablet (50 mg total) by mouth every 6 (six) hours as needed. 15 tablet 0  . traMADol (ULTRAM) 50 MG tablet Take 1 tablet (50 mg total) by mouth every 6 (six) hours as needed. 60 tablet 3   No current facility-administered medications for this visit.     Physical Exam  Blood pressure 154/95, pulse 80, temperature 99.5 F (37.5 C), height 6' (1.829 m), weight 250 lb (113.399 kg).  Constitutional: overall normal hygiene, normal nutrition, well developed, normal grooming, normal body habitus. Assistive device:crutches, braces  Musculoskeletal: gait and station Limp left, muscle tone and strength are normal, no tremors or atrophy is present.  .  Neurological: coordination overall normal.  Deep tendon reflex/nerve stretch intact.  Sensation normal.  Cranial nerves II-XII intact.   Skin:   normal overall no scars, lesions, ulcers or rashes. No psoriasis.  Psychiatric: Alert and oriented x 3.  Recent memory intact, remote memory unclear.  Normal mood and affect. Well groomed.  Good eye contact.  Cardiovascular: overall no swelling, no varicosities, no edema bilaterally, normal temperatures of the legs and arms, no clubbing, cyanosis and good capillary refill.  Lymphatic: palpation is normal.  The left lower extremity is examined:  Inspection:  Thigh:  Non-tender and no defects  Knee has swelling 1+ effusion.  Joint tenderness is present                        Patient is tender over the medial joint line  Lower Leg:  Has normal appearance and no tenderness or defects  Ankle:  Non-tender and no defects  Foot:  Non-tender and no defects Range of Motion:  Knee:  Range of motion is: 0-110                        Crepitus is not  present  Ankle:  Range of motion is normal. Strength and Tone:  The left lower extremity has normal strength and tone. Stability:  Knee:  The knee is  stable.  Ankle:  The ankle is stable.  The patient has been educated about the nature of the problem(s) and counseled on treatment options.  The patient appeared to understand what I have discussed and is in agreement with it.  Encounter Diagnosis  Name Primary?  . Left knee pain Yes    PLAN Call if any problems.  Precautions discussed.  Continue current medications.   Return to clinic 2 weeks   Remain out of work.

## 2015-07-10 NOTE — Patient Instructions (Signed)
Out of work  Continue crutches.  Call if any problem

## 2015-07-12 ENCOUNTER — Ambulatory Visit: Payer: BLUE CROSS/BLUE SHIELD | Admitting: Orthopaedic Surgery

## 2015-07-18 ENCOUNTER — Ambulatory Visit: Payer: BLUE CROSS/BLUE SHIELD | Admitting: Orthopaedic Surgery

## 2015-07-24 ENCOUNTER — Encounter: Payer: Self-pay | Admitting: Orthopaedic Surgery

## 2015-07-24 ENCOUNTER — Ambulatory Visit (INDEPENDENT_AMBULATORY_CARE_PROVIDER_SITE_OTHER): Payer: BLUE CROSS/BLUE SHIELD | Admitting: Orthopaedic Surgery

## 2015-07-24 VITALS — BP 151/98 | HR 85 | Temp 99.9°F | Ht 72.0 in | Wt 245.0 lb

## 2015-07-24 DIAGNOSIS — M25562 Pain in left knee: Secondary | ICD-10-CM | POA: Diagnosis not present

## 2015-07-24 NOTE — Patient Instructions (Signed)
Stay out of work. 

## 2015-07-24 NOTE — Progress Notes (Signed)
Patient ZO:XWRU:Alan Guzman, male DOB:06/11/1980, 35 y.o. EAV:[id number]  Chief Complaint  Patient presents with  . Follow-up    left knee    HPI  Alan Guzman is a 35 y.o. male who has left knee pain.  He had a MRI that showed a chondral fissure but no fracture.  He is doing better.  He is wearing a knee brace now and walking better.  He has some pain laterally but overall doing well.  He has less swelling.  He has no new trauma.  HPI  Body mass index is 33.22 kg/(m^2).  Review of Systems  Constitutional:       He is an active smoker.  HENT: Negative for congestion.   Respiratory: Negative for cough and shortness of breath.   Cardiovascular: Negative for chest pain.  Endocrine: Negative for cold intolerance.  Musculoskeletal: Positive for myalgias, joint swelling, arthralgias and gait problem.  Allergic/Immunologic: Negative for environmental allergies.  Neurological: Negative for numbness.  All other systems reviewed and are negative.   No past medical history on file.  No past surgical history on file.  No family history on file.  Social History Social History  Substance Use Topics  . Smoking status: Current Every Day Smoker -- 0.50 packs/day    Types: Cigarettes  . Smokeless tobacco: Never Used  . Alcohol Use: 0.0 oz/week    0 Standard drinks or equivalent per week     Comment: occasionally    No Known Allergies  Current Outpatient Prescriptions  Medication Sig Dispense Refill  . acetaminophen (TYLENOL) 500 MG tablet Take 500 mg by mouth every 6 (six) hours as needed for mild pain or moderate pain.    Marland Kitchen. dexamethasone (DECADRON) 4 MG tablet Take 1 tablet (4 mg total) by mouth 2 (two) times daily with a meal. 12 tablet 0  . diclofenac (VOLTAREN) 75 MG EC tablet Take 1 tablet (75 mg total) by mouth 2 (two) times daily with a meal. 60 tablet 2  . ibuprofen (ADVIL,MOTRIN) 800 MG tablet Take 1 tablet (800 mg total) by mouth 3 (three) times daily. 21 tablet 0  . traMADol  (ULTRAM) 50 MG tablet Take 1 tablet (50 mg total) by mouth every 6 (six) hours as needed. 15 tablet 0  . traMADol (ULTRAM) 50 MG tablet Take 1 tablet (50 mg total) by mouth every 6 (six) hours as needed. 60 tablet 3   No current facility-administered medications for this visit.     Physical Exam  Blood pressure 151/98, pulse 85, temperature 99.9 F (37.7 C), height 6' (1.829 m), weight 245 lb (111.131 kg).  Constitutional: overall normal hygiene, normal nutrition, well developed, normal grooming, normal body habitus. Assistive device:hinge knee brace  Musculoskeletal: gait and station Limp slight to left, muscle tone and strength are normal, no tremors or atrophy is present.  .  Neurological: coordination overall normal.  Deep tendon reflex/nerve stretch intact.  Sensation normal.  Cranial nerves II-XII intact.   Skin:   normal overall no scars, lesions, ulcers or rashes. No psoriasis.  Psychiatric: Alert and oriented x 3.  Recent memory intact, remote memory unclear.  Normal mood and affect. Well groomed.  Good eye contact.  Cardiovascular: overall no swelling, no varicosities, no edema bilaterally, normal temperatures of the legs and arms, no clubbing, cyanosis and good capillary refill.  Lymphatic: palpation is normal.  The left lower extremity is examined:  Inspection:  Thigh:  Non-tender and no defects  Knee does not have swelling 0  effusion.                        Joint tenderness is present                        Patient is not tender over the medial joint line  Lower Leg:  Has normal appearance and no tenderness or defects  Ankle:  Non-tender and no defects  Foot:  Non-tender and no defects Range of Motion:  Knee:  Range of motion is: full                        Crepitus is  present  Ankle:  Range of motion is normal. Strength and Tone:  The left lower extremity has normal strength and tone. Stability:  Knee:  The knee is stable.  Ankle:  The ankle is  stable.  Right knee negative.  The patient has been educated about the nature of the problem(s) and counseled on treatment options.  The patient appeared to understand what I have discussed and is in agreement with it.  Encounter Diagnosis  Name Primary?  . Left knee pain Yes    PLAN Call if any problems.  Precautions discussed.  Continue current medications.   Return to clinic 2 weeks   Stay out of work.  Continue return to work after next visit.

## 2015-07-27 ENCOUNTER — Telehealth: Payer: Self-pay | Admitting: Orthopaedic Surgery

## 2015-07-27 NOTE — Telephone Encounter (Signed)
Form faxed for short-term disability [facility] [employer] [place] to Fax# [phone]/ Ph# [phone]; signed authorization release of information received and attached.  Patient aware of status.

## 2015-08-07 ENCOUNTER — Encounter: Payer: Self-pay | Admitting: Orthopaedic Surgery

## 2015-08-07 ENCOUNTER — Ambulatory Visit (INDEPENDENT_AMBULATORY_CARE_PROVIDER_SITE_OTHER): Payer: BLUE CROSS/BLUE SHIELD | Admitting: Orthopaedic Surgery

## 2015-08-07 VITALS — BP 152/101 | HR 74 | Temp 98.1°F | Ht 72.0 in | Wt 252.0 lb

## 2015-08-07 DIAGNOSIS — M25562 Pain in left knee: Secondary | ICD-10-CM | POA: Diagnosis not present

## 2015-08-07 NOTE — Patient Instructions (Signed)
Return to work note in two weeks

## 2015-08-07 NOTE — Progress Notes (Signed)
Patient Alan Guzman, male DOB:Feb 19, 1981, 35 y.o. EAV:[id number]  Chief Complaint  Patient presents with  . Follow-up    left knee    HPI  Alan Guzman is a 35 y.o. male who has had pain in the left knee.  He is much better now.  He has no assistive device.  He has little pain but still has some swelling.  I will have him return to work in two weeks and I will see him back in one month.  HPI  Body mass index is 34.17 kg/(m^2).  ROS  Review of Systems  Constitutional:       He is an active smoker.  HENT: Negative for congestion.   Respiratory: Negative for cough and shortness of breath.   Cardiovascular: Negative for chest pain.  Endocrine: Negative for cold intolerance.  Musculoskeletal: Positive for myalgias, joint swelling, arthralgias and gait problem.  Allergic/Immunologic: Negative for environmental allergies.  Neurological: Negative for numbness.  All other systems reviewed and are negative.   History reviewed. No pertinent past medical history.  History reviewed. No pertinent past surgical history.  History reviewed. No pertinent family history.  Social History Social History  Substance Use Topics  . Smoking status: Former Smoker -- 0.50 packs/day    Types: Cigarettes    Quit date: 07/24/2015  . Smokeless tobacco: Never Used  . Alcohol Use: 0.0 oz/week    0 Standard drinks or equivalent per week     Comment: occasionally    No Known Allergies  Current Outpatient Prescriptions  Medication Sig Dispense Refill  . acetaminophen (TYLENOL) 500 MG tablet Take 500 mg by mouth every 6 (six) hours as needed for mild pain or moderate pain. Reported on 08/07/2015    . dexamethasone (DECADRON) 4 MG tablet Take 1 tablet (4 mg total) by mouth 2 (two) times daily with a meal. (Patient not taking: Reported on 08/07/2015) 12 tablet 0  . diclofenac (VOLTAREN) 75 MG EC tablet Take 1 tablet (75 mg total) by mouth 2 (two) times daily with a meal. (Patient not taking: Reported  on 08/07/2015) 60 tablet 2  . ibuprofen (ADVIL,MOTRIN) 800 MG tablet Take 1 tablet (800 mg total) by mouth 3 (three) times daily. (Patient not taking: Reported on 08/07/2015) 21 tablet 0  . traMADol (ULTRAM) 50 MG tablet Take 1 tablet (50 mg total) by mouth every 6 (six) hours as needed. (Patient not taking: Reported on 08/07/2015) 15 tablet 0  . traMADol (ULTRAM) 50 MG tablet Take 1 tablet (50 mg total) by mouth every 6 (six) hours as needed. (Patient not taking: Reported on 08/07/2015) 60 tablet 3   No current facility-administered medications for this visit.     Physical Exam  Blood pressure 152/101, pulse 74, temperature 98.1 F (36.7 C), height 6' (1.829 m), weight 252 lb (114.306 kg).  Constitutional: overall normal hygiene, normal nutrition, well developed, normal grooming, normal body habitus. Assistive device:none  Musculoskeletal: gait and station Limp none, muscle tone and strength are normal, no tremors or atrophy is present.  .  Neurological: coordination overall normal.  Deep tendon reflex/nerve stretch intact.  Sensation normal.  Cranial nerves II-XII intact.   Skin:   normal overall no scars, lesions, ulcers or rashes. No psoriasis.  Psychiatric: Alert and oriented x 3.  Recent memory intact, remote memory unclear.  Normal mood and affect. Well groomed.  Good eye contact.  Cardiovascular: overall no swelling, no varicosities, no edema bilaterally, normal temperatures of the legs and arms, no clubbing,  cyanosis and good capillary refill.  Lymphatic: palpation is normal.   Right Knee Exam  Right knee exam is normal.  Range of Motion  The patient has normal right knee ROM.  Muscle Strength   The patient has normal right knee strength.  Tests  McMurray:  Medial - negative   Other  Erythema: absent Scars: absent Sensation: normal Pulse: present Swelling: none   Left Knee Exam  Left knee exam is normal.  Tenderness  The patient is experiencing tenderness  in the medial joint line.  Range of Motion  The patient has normal left knee ROM.  Muscle Strength   The patient has normal left knee strength.  Tests  McMurray:  Medial - negative   Other  Erythema: absent Scars: absent Sensation: normal Pulse: present Swelling: none      The patient has been educated about the nature of the problem(s) and counseled on treatment options.  The patient appeared to understand what I have discussed and is in agreement with it.  Encounter Diagnosis  Name Primary?  . Left knee pain Yes    PLAN Call if any problems.  Precautions discussed.  Continue current medications.   Return to clinic 1 month

## 2015-09-06 ENCOUNTER — Ambulatory Visit: Payer: BLUE CROSS/BLUE SHIELD | Admitting: Orthopaedic Surgery

## 2016-07-09 ENCOUNTER — Telehealth (HOSPITAL_COMMUNITY): Payer: Self-pay | Admitting: *Deleted

## 2016-07-09 NOTE — Telephone Encounter (Signed)
left voice message regarding an appointment. 

## 2016-07-15 ENCOUNTER — Telehealth (HOSPITAL_COMMUNITY): Payer: Self-pay | Admitting: *Deleted

## 2016-07-15 NOTE — Telephone Encounter (Signed)
left 2nd voice message regarding an appointment. 

## 2016-07-22 ENCOUNTER — Ambulatory Visit (INDEPENDENT_AMBULATORY_CARE_PROVIDER_SITE_OTHER): Payer: BLUE CROSS/BLUE SHIELD | Admitting: Psychiatry

## 2016-07-22 ENCOUNTER — Encounter (HOSPITAL_COMMUNITY): Payer: Self-pay | Admitting: Psychiatry

## 2016-07-22 ENCOUNTER — Encounter (INDEPENDENT_AMBULATORY_CARE_PROVIDER_SITE_OTHER): Payer: Self-pay

## 2016-07-22 VITALS — BP 138/97 | HR 67 | Ht 72.0 in | Wt 237.0 lb

## 2016-07-22 DIAGNOSIS — Z87891 Personal history of nicotine dependence: Secondary | ICD-10-CM | POA: Diagnosis not present

## 2016-07-22 DIAGNOSIS — F410 Panic disorder [episodic paroxysmal anxiety] without agoraphobia: Secondary | ICD-10-CM

## 2016-07-22 DIAGNOSIS — Z79899 Other long term (current) drug therapy: Secondary | ICD-10-CM

## 2016-07-22 DIAGNOSIS — F321 Major depressive disorder, single episode, moderate: Secondary | ICD-10-CM

## 2016-07-22 DIAGNOSIS — Z818 Family history of other mental and behavioral disorders: Secondary | ICD-10-CM

## 2016-07-22 MED ORDER — LAMOTRIGINE 25 MG PO TABS: ORAL_TABLET | ORAL | 1 refills | Status: DC

## 2016-07-22 NOTE — Patient Instructions (Signed)
1. Start lamotrigine 25 mg daily for two weeks, then 50 mg daily 2. Continue clonazepam 1 mg twice a day as needed for anxiety 3. Return to clinic in one month
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
## 2016-07-23 ENCOUNTER — Encounter (HOSPITAL_COMMUNITY): Payer: Self-pay | Admitting: Psychiatry

## 2016-08-06 ENCOUNTER — Ambulatory Visit (INDEPENDENT_AMBULATORY_CARE_PROVIDER_SITE_OTHER): Payer: BLUE CROSS/BLUE SHIELD | Admitting: Psychiatry

## 2016-08-06 ENCOUNTER — Encounter (HOSPITAL_COMMUNITY): Payer: Self-pay | Admitting: Psychiatry

## 2016-08-06 VITALS — BP 127/81 | HR 80 | Ht 72.0 in | Wt 233.2 lb

## 2016-08-06 DIAGNOSIS — Z79899 Other long term (current) drug therapy: Secondary | ICD-10-CM | POA: Diagnosis not present

## 2016-08-06 DIAGNOSIS — F410 Panic disorder [episodic paroxysmal anxiety] without agoraphobia: Secondary | ICD-10-CM

## 2016-08-06 DIAGNOSIS — Z818 Family history of other mental and behavioral disorders: Secondary | ICD-10-CM

## 2016-08-06 DIAGNOSIS — F321 Major depressive disorder, single episode, moderate: Secondary | ICD-10-CM | POA: Diagnosis not present

## 2016-08-06 DIAGNOSIS — Z87891 Personal history of nicotine dependence: Secondary | ICD-10-CM | POA: Diagnosis not present
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
## 2016-08-06 NOTE — Patient Instructions (Addendum)
1. Continue lamotrigine 25 mg daily  2. Try decreasing clonazepam 0.5 mg twice a day or 1 mg once a day (If it does not work, you can go back to 1 mg twice a day) 3. Return to clinic on 5/31
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
## 2016-08-21 ENCOUNTER — Ambulatory Visit (INDEPENDENT_AMBULATORY_CARE_PROVIDER_SITE_OTHER): Payer: BLUE CROSS/BLUE SHIELD | Admitting: Psychiatry

## 2016-08-21 ENCOUNTER — Encounter (HOSPITAL_COMMUNITY): Payer: Self-pay | Admitting: Psychiatry

## 2016-08-21 VITALS — BP 141/68 | HR 60 | Resp 14 | Wt 235.6 lb

## 2016-08-21 DIAGNOSIS — F410 Panic disorder [episodic paroxysmal anxiety] without agoraphobia: Secondary | ICD-10-CM | POA: Diagnosis not present

## 2016-08-21 DIAGNOSIS — Z87891 Personal history of nicotine dependence: Secondary | ICD-10-CM

## 2016-08-21 DIAGNOSIS — F329 Major depressive disorder, single episode, unspecified: Secondary | ICD-10-CM

## 2016-08-21 DIAGNOSIS — Z79899 Other long term (current) drug therapy: Secondary | ICD-10-CM | POA: Diagnosis not present

## 2016-08-21 DIAGNOSIS — F419 Anxiety disorder, unspecified: Secondary | ICD-10-CM

## 2016-08-21 DIAGNOSIS — Z818 Family history of other mental and behavioral disorders: Secondary | ICD-10-CM

## 2016-08-21 DIAGNOSIS — F321 Major depressive disorder, single episode, moderate: Secondary | ICD-10-CM

## 2016-08-21 MED ORDER — LAMOTRIGINE 100 MG PO TABS: 50.0000 mg | ORAL_TABLET | Freq: Every day | ORAL | 1 refills | Status: AC

## 2016-08-21 NOTE — Patient Instructions (Addendum)
1. Increase lamotrigine 50 mg daily 2. Continue clonazepam 1 mg once a day  3. Return to clinic in three weeks

## 2016-09-02 ENCOUNTER — Telehealth (HOSPITAL_COMMUNITY): Payer: Self-pay | Admitting: *Deleted

## 2016-09-02 NOTE — Telephone Encounter (Signed)
ERROR
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
## 2016-09-11 ENCOUNTER — Encounter (HOSPITAL_COMMUNITY): Payer: Self-pay | Admitting: Psychiatry

## 2016-09-11 ENCOUNTER — Ambulatory Visit (INDEPENDENT_AMBULATORY_CARE_PROVIDER_SITE_OTHER): Payer: BLUE CROSS/BLUE SHIELD | Admitting: Psychiatry

## 2016-09-11 VITALS — BP 144/99 | HR 82 | Ht 72.0 in | Wt 227.0 lb

## 2016-09-11 DIAGNOSIS — Z79899 Other long term (current) drug therapy: Secondary | ICD-10-CM

## 2016-09-11 DIAGNOSIS — Z87891 Personal history of nicotine dependence: Secondary | ICD-10-CM | POA: Diagnosis not present

## 2016-09-11 DIAGNOSIS — F321 Major depressive disorder, single episode, moderate: Secondary | ICD-10-CM

## 2016-09-11 DIAGNOSIS — F410 Panic disorder [episodic paroxysmal anxiety] without agoraphobia: Secondary | ICD-10-CM

## 2016-09-11 DIAGNOSIS — Z818 Family history of other mental and behavioral disorders: Secondary | ICD-10-CM

## 2016-09-11 DIAGNOSIS — F329 Major depressive disorder, single episode, unspecified: Secondary | ICD-10-CM | POA: Diagnosis not present

## 2016-09-11 MED ORDER — SERTRALINE HCL 50 MG PO TABS: ORAL_TABLET | ORAL | 1 refills | Status: AC

## 2016-09-11 NOTE — Patient Instructions (Signed)
1. Discontinue lamotrigine  2. Start sertraline 25 mg daily for two weeks, then 50 mg daily 3. Continue clonazepam 1 mg once a day as needed for anxiety 4. Return to clinic in one month for 30 mins

## 2016-09-19 ENCOUNTER — Telehealth (HOSPITAL_COMMUNITY): Payer: Self-pay | Admitting: *Deleted

## 2016-09-19 ENCOUNTER — Encounter (HOSPITAL_COMMUNITY): Payer: Self-pay | Admitting: Psychiatry

## 2016-09-19 NOTE — Telephone Encounter (Signed)
TC to patient left a message stating that return to work lett was done and that we needed to find out if she was going to pick up or do we need to fax it for her.

## 2016-09-19 NOTE — Telephone Encounter (Signed)
Done

## 2016-09-19 NOTE — Telephone Encounter (Signed)
Letter was faxed to [employer] [phone]

## 2016-09-19 NOTE — Telephone Encounter (Signed)
voice message from patient, he is returning to work today and he need a return to work note stating he is returning to work 40 hours a week with no restrictions.
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
## 2016-10-07 ENCOUNTER — Ambulatory Visit (HOSPITAL_COMMUNITY): Payer: Self-pay | Admitting: Psychiatry

## 2017-01-31 DIAGNOSIS — Z79899 Other long term (current) drug therapy: Secondary | ICD-10-CM | POA: Diagnosis not present

## 2017-01-31 DIAGNOSIS — M25562 Pain in left knee: Secondary | ICD-10-CM | POA: Insufficient documentation

## 2017-01-31 DIAGNOSIS — Z87891 Personal history of nicotine dependence: Secondary | ICD-10-CM | POA: Diagnosis not present

## 2017-01-31 DIAGNOSIS — M25561 Pain in right knee: Secondary | ICD-10-CM | POA: Insufficient documentation

## 2017-02-01 ENCOUNTER — Encounter (HOSPITAL_COMMUNITY): Payer: Self-pay | Admitting: *Deleted

## 2017-02-01 ENCOUNTER — Other Ambulatory Visit: Payer: Self-pay

## 2017-02-01 ENCOUNTER — Emergency Department (HOSPITAL_COMMUNITY): Payer: BLUE CROSS/BLUE SHIELD

## 2017-02-01 ENCOUNTER — Emergency Department (HOSPITAL_COMMUNITY)
Admission: EM | Admit: 2017-02-01 | Discharge: 2017-02-01 | Disposition: A | Discharge status: Home / Self Care | Payer: BLUE CROSS/BLUE SHIELD | Attending: Emergency Medicine | Admitting: Emergency Medicine

## 2017-02-01 DIAGNOSIS — M25561 Pain in right knee: Secondary | ICD-10-CM

## 2017-02-01 DIAGNOSIS — M25562 Pain in left knee: Secondary | ICD-10-CM

## 2017-02-01 MED ORDER — IBUPROFEN 400 MG PO TABS
600.0000 mg | ORAL_TABLET | Freq: Once | ORAL | Status: AC
  Administered 2017-02-01: 600 mg via ORAL
  Filled 2017-02-01: qty 2

## 2017-02-01 MED ORDER — NAPROXEN 500 MG PO TABS: ORAL_TABLET | ORAL | 0 refills | Status: AC

## 2017-02-01 NOTE — Discharge Instructions (Signed)
Use ice for comfort and swelling. Wear the knee sleeve and knee immobilizer for support and comfort. Take the naproxen for pain. Call Dr Sanjuan DameKeeling's office to have him recheck your knees.
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
## 2017-07-22 DEATH — deceased

## 2018-01-06 IMAGING — DX DG KNEE COMPLETE 4+V*L*
4 series · 4 of 4 positions shown · non-contrast
Comparison: None.

CLINICAL DATA: Pain about the left patella radiating into the upper
and lower leg for 1 week. No known injury. Initial encounter.

EXAM:
LEFT KNEE - COMPLETE 4+ VIEW

[knee ap (1 of 3)]
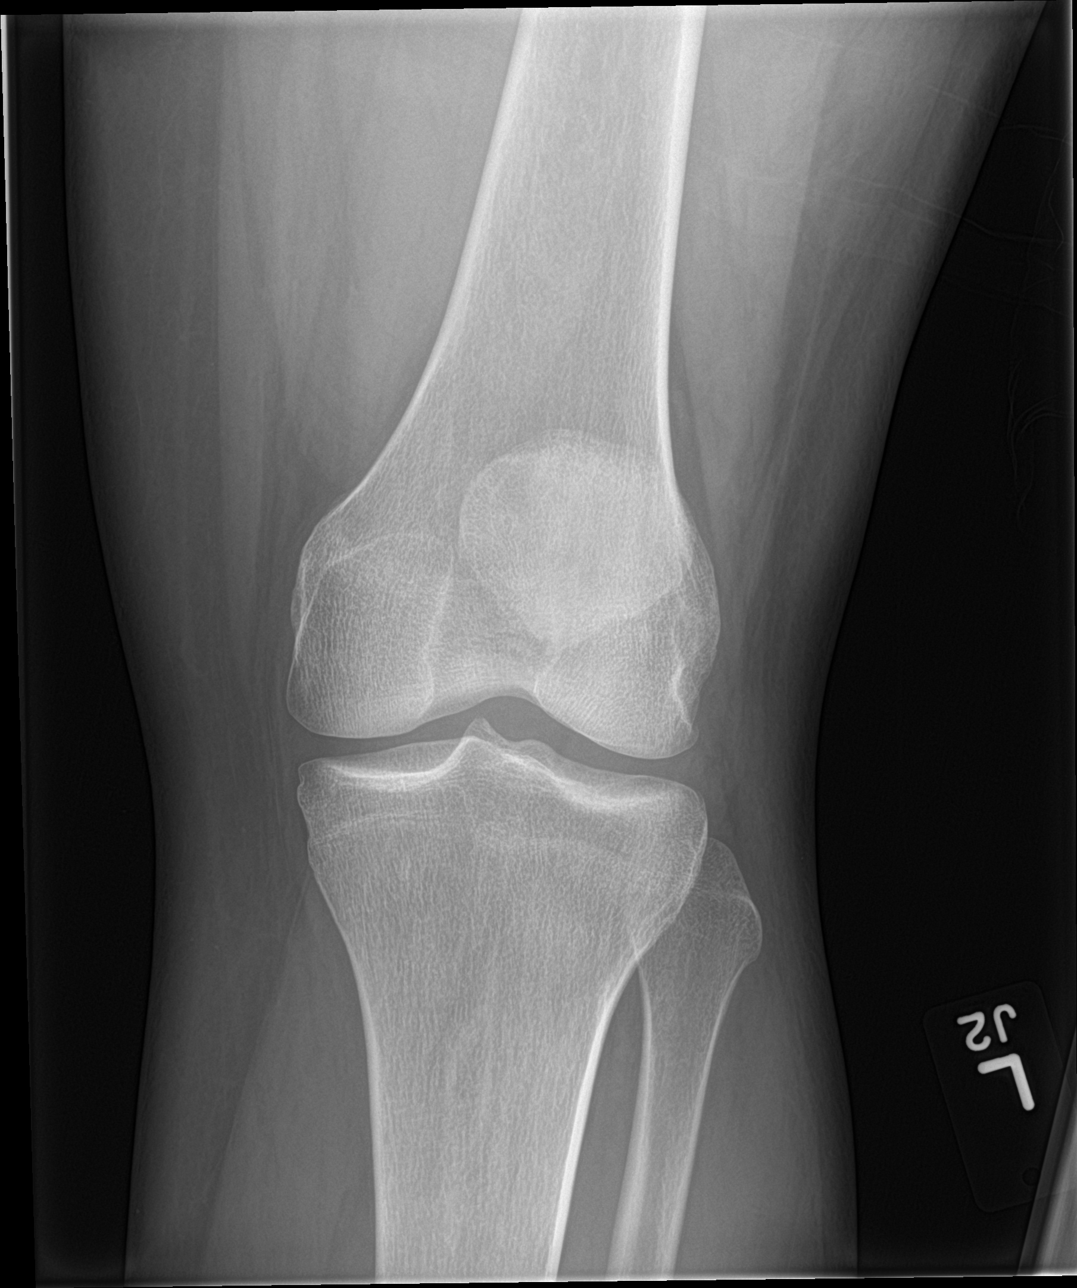

[knee ap (2 of 3)]
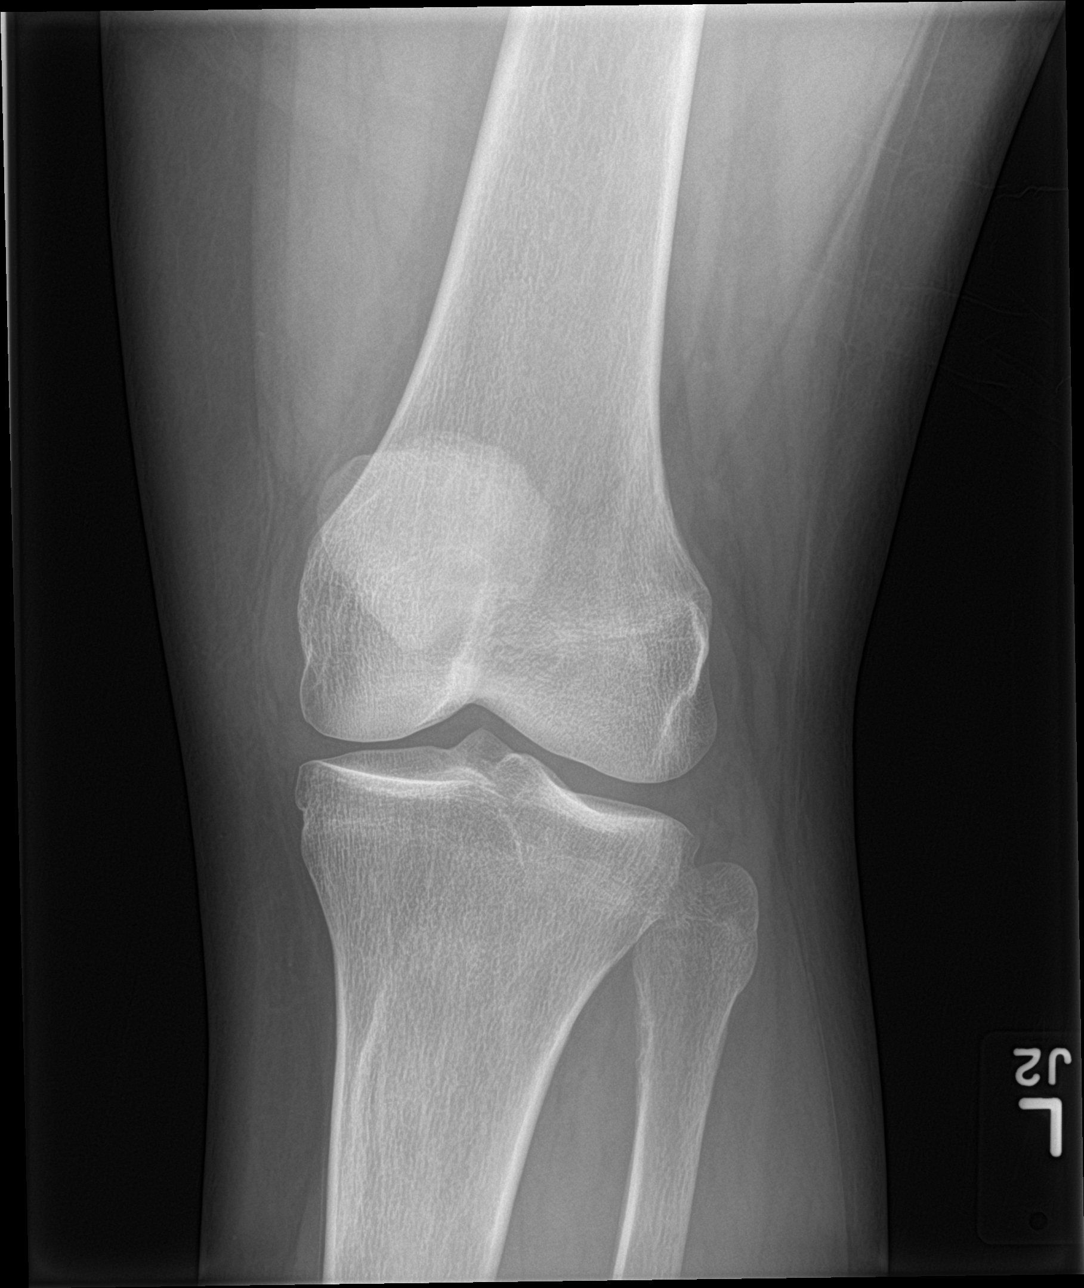

[knee ap (3 of 3)]
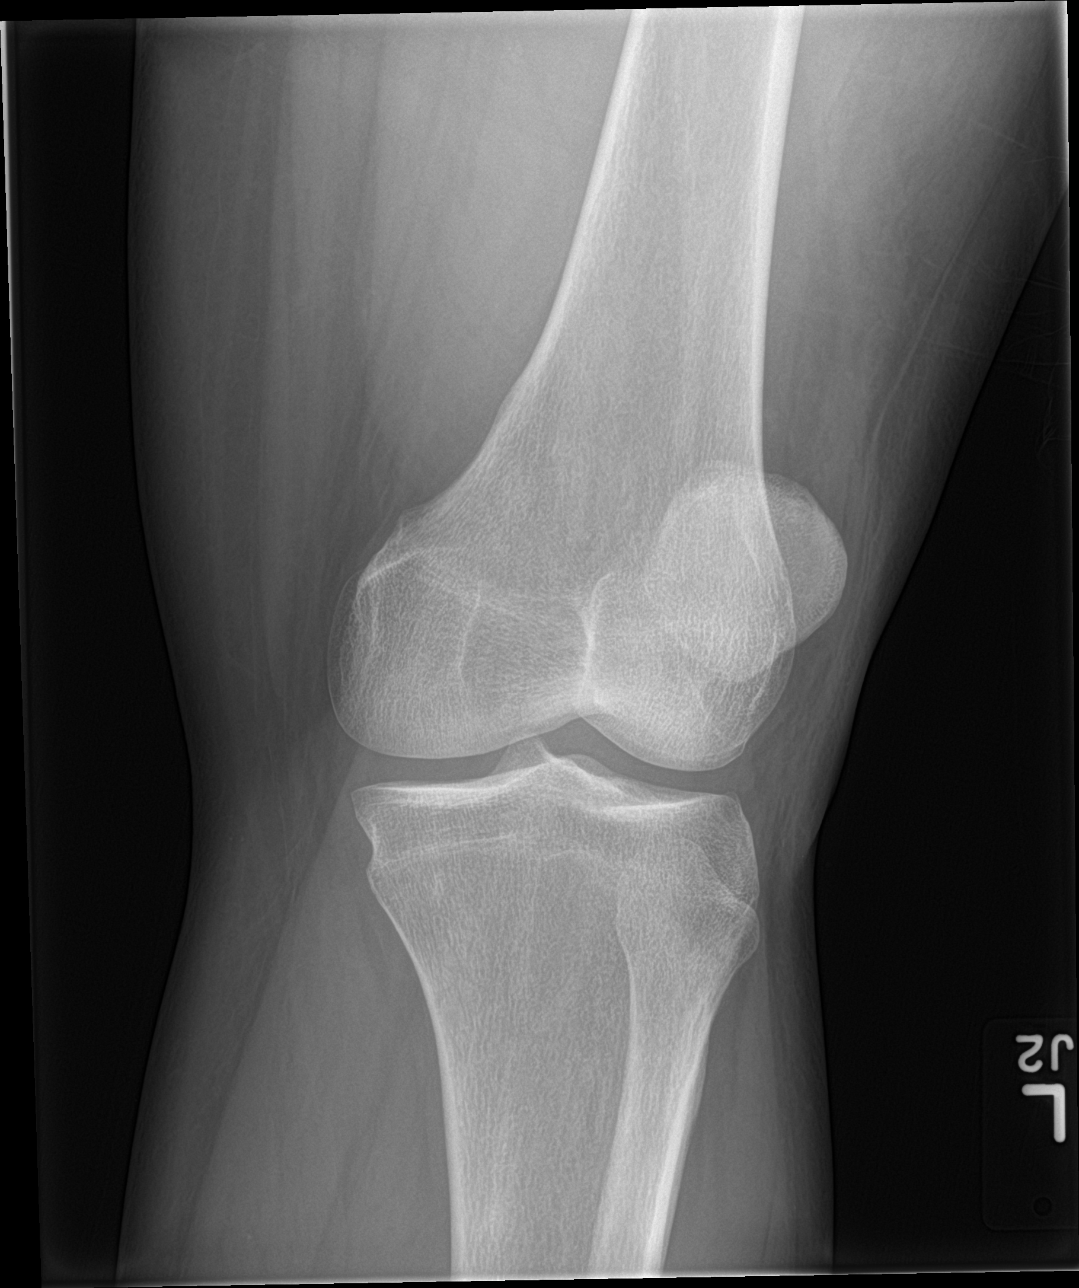

[knee lat]
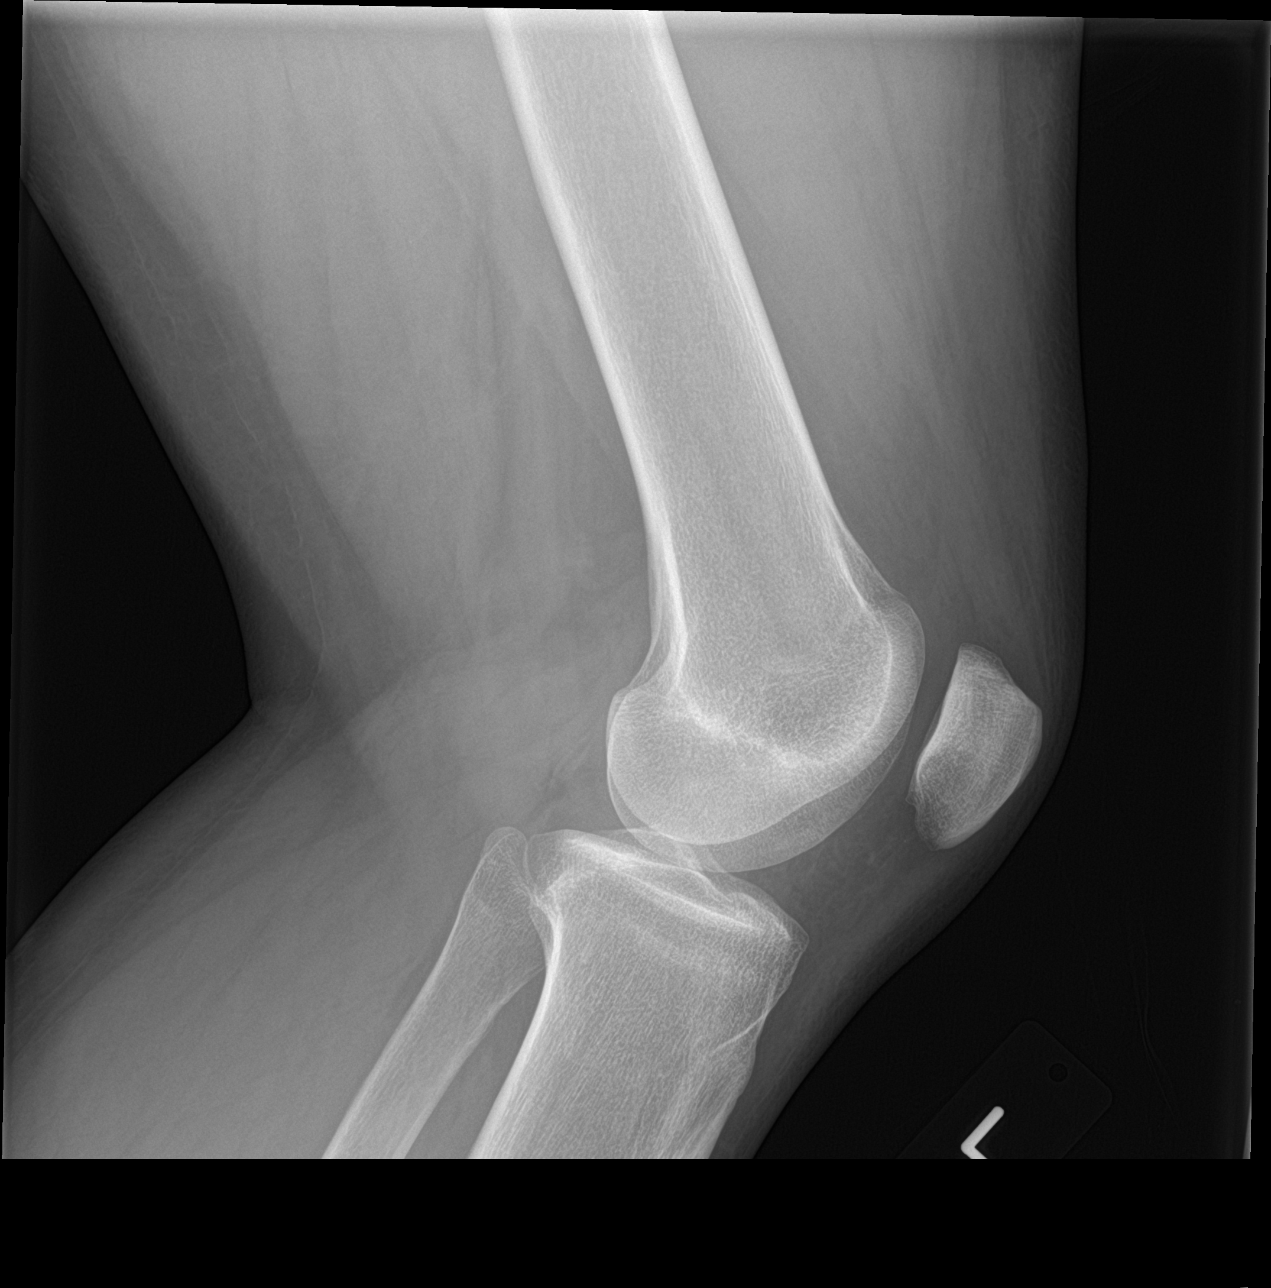

[4 of 4 positions shown; findings below may reference images not displayed]

FINDINGS: There is no evidence of fracture, dislocation, or joint effusion.
There is no evidence of arthropathy or other focal bone abnormality.
Soft tissues are unremarkable.
IMPRESSION: Normal study.
